# Patient Record
Sex: Female | Born: 2000
Health system: Southern US, Community
[De-identification: ages and names within clinical notes are randomized; demographics above are authoritative.]

## PROBLEM LIST (undated history)

## (undated) ENCOUNTER — Ambulatory Visit: Admission: EM | Payer: Federal, State, Local not specified - PPO

## (undated) DIAGNOSIS — F419 Anxiety disorder, unspecified: Secondary | ICD-10-CM

## (undated) DIAGNOSIS — K219 Gastro-esophageal reflux disease without esophagitis: Secondary | ICD-10-CM

## (undated) HISTORY — DX: Anxiety disorder, unspecified: F41.9

## (undated) HISTORY — DX: Gastro-esophageal reflux disease without esophagitis: K21.9

---
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
## 2019-01-09 ENCOUNTER — Other Ambulatory Visit: Payer: Self-pay

## 2019-01-09 ENCOUNTER — Telehealth: Payer: Federal, State, Local not specified - PPO | Admitting: Gastroenterology

## 2019-01-11 ENCOUNTER — Other Ambulatory Visit: Payer: Self-pay

## 2019-01-11 ENCOUNTER — Telehealth (INDEPENDENT_AMBULATORY_CARE_PROVIDER_SITE_OTHER): Payer: Federal, State, Local not specified - PPO | Admitting: Gastroenterology

## 2019-01-11 ENCOUNTER — Encounter: Payer: Self-pay | Admitting: Gastroenterology

## 2019-01-11 VITALS — Ht 68.0 in | Wt 185.0 lb

## 2019-01-11 DIAGNOSIS — K219 Gastro-esophageal reflux disease without esophagitis: Secondary | ICD-10-CM | POA: Diagnosis not present

## 2019-01-11 DIAGNOSIS — R110 Nausea: Secondary | ICD-10-CM

## 2019-01-11 NOTE — Progress Notes (Signed)
Chief Complaint: GERD, nausea  Referring Provider:    Self   HPI:    Due to current restrictions/limitations of in-office visits due to the COVID-19 pandemic, this scheduled clinical appointment was converted to a telehealth virtual consultation using Dox[employer].  -Time of medical discussion: 20 minutes -The patient did consent to this virtual visit and is aware of possible charges through their insurance for this visit.  -Names of all parties present: Kaylee Carson (patient), Gerrit Heck, DO, [facility] (physician) -Patient location: Home -Physician location: Office  Kaylee Carson is a 18 y.o. female presenting to the Gastroenterology Clinic for evaluation of reflux symptoms and nausea.  She states she has a hx of reflux diagnosed in HS, treated with omeprazole, which controlled sxs, but stopped due to side effects (decreased appetite). Has had recurrence of her index reflux symptoms, described as HB, regurgitation, increased belching, nausea w/o emesis, and indigestion.  Occasional globus sensation. No changes in weight. No dysphagia. No previous EGD. Now taking Tums and Alka Seltzer near daily. Exacerbated by acidic foods. Sxs can occur throughout the day or night along with typically 30 mins post PO.   FHx n/f heartburn (father) and IBS ([facility]).  Otherwise, no known family history of GI malignancy, IBD, liver disease.  No recent labs or imaging for review.   Past medical history, past surgical history, social history, family history, medications, and allergies reviewed in the chart and with patient.    History reviewed. No pertinent past medical history.   History reviewed. No pertinent surgical history. Family History  Problem Relation Age of Onset  . Colon cancer Neg Hx    Social History   Tobacco Use  . Smoking status: Never Smoker  . Smokeless tobacco: Never Used  Substance Use Topics  . Alcohol use: Not Currently  . Drug use: Not on file   No current  outpatient medications on file.   No current facility-administered medications for this visit.    Not on File   Review of Systems: All systems reviewed and negative except where noted in HPI.     Physical Exam:    Complete physical exam not completed due to the nature of this telehealth communication.   Gen: Awake, alert, and oriented, and well communicative. HEENT: EOMI, non-icteric sclera, NCAT, MMM Neck: Normal movement of head and neck Pulm: No labored breathing, speaking in full sentences without conversational dyspnea Derm: No apparent lesions or bruising in visible field MS: Moves all visible extremities without noticeable abnormality Psych: Pleasant, cooperative, normal speech, thought processing seemingly intact   ASSESSMENT AND PLAN;   1) GERD 2) Nausea without emesis  Discussed the pathophysiology of reflux at length today, to include diagnostic evaluation and treatment options.  Offered course of high-dose PPI therapy x4 weeks for diagnostic/therapeutic intent.  However, given her previous intolerance to omeprazole and consideration for wanting antireflux surgical options, we will instead proceed as below:  -EGD to evaluate for LES laxity, hiatal hernia, erosive esophagitis, and for preoperative assessment for a possible antireflux surgery as treatment option - Continue antireflux lifestyle measures and dietary modifications/food avoidance - Suspect nausea is component of her underlying reflux, but will certainly plan to evaluate for gastritis, PUD,  GOO, and gastric biopsies at time of EGD  The indications, risks, and benefits of EGD were explained to the patient in detail. Risks include but are not limited to bleeding, perforation, adverse reaction to medications, and  cardiopulmonary compromise. Sequelae include but are not limited to the possibility of surgery, hositalization, and mortality. The patient verbalized understanding and wished to proceed. All questions  answered, referred to scheduler. Further recommendations pending results of the exam.     Shellia CleverlyVito V Feliciana Narayan, DO, FACG  01/11/2019, 10:19 AM   Nyoka CowdenMacDonald, Laurie, MD

## 2019-01-11 NOTE — Patient Instructions (Addendum)
If you are age 19 or older, your body mass index should be between 23-30. Your Body mass index is 28.13 kg/m. If this is out of the aforementioned range listed, please consider follow up with your Primary Care Provider.  If you are age 54 or younger, your body mass index should be between 19-25. Your Body mass index is 28.13 kg/m. If this is out of the aformentioned range listed, please consider follow up with your Primary Care Provider.   To help prevent the possible spread of infection to our patients, communities, and staff; we will be implementing the following measures:  As of now we are not allowing any visitors/family members to accompany you to any upcoming appointments with [facility]. If you have any concerns about this please contact our office to discuss prior to the appointment.   You have been scheduled for an endoscopy. Please follow written instructions given to you at your visit today. If you use inhalers (even only as needed), please bring them with you on the day of your procedure. Your physician has requested that you go to www.startemmi.com and enter the access code given to you at your visit today. This web site gives a general overview about your procedure. However, you should still follow specific instructions given to you by our office regarding your preparation for the procedure.  It was a pleasure to see you today!  Vito Cirigliano, D.O.

## 2019-01-18 DIAGNOSIS — K006 Disturbances in tooth eruption: Secondary | ICD-10-CM | POA: Diagnosis not present

## 2019-01-18 DIAGNOSIS — K011 Impacted teeth: Secondary | ICD-10-CM | POA: Diagnosis not present

## 2019-01-18 HISTORY — PX: WISDOM TOOTH EXTRACTION: SHX21

## 2019-01-23 ENCOUNTER — Telehealth: Payer: Self-pay | Admitting: Gastroenterology

## 2019-01-23 NOTE — Telephone Encounter (Signed)
Spoke with patient regarding Covid-19 screening questions °Covid-19 Screening Questions: ° °Do you now or have you had a fever in the last 14 days? no ° °Do you have any respiratory symptoms of shortness of breath or  °cough now or in the last 14 days? No   ° °Do you have any family members or close contacts with diagnosed or suspected Covid-19 in the past 14 days? no ° °Have you been tested for Covid-19 and found to be positive?  no ° °Pt made aware of that care partner may wait in the car or come up to the lobby during the procedure but will need to provide their own mask. °

## 2019-01-24 ENCOUNTER — Ambulatory Visit (AMBULATORY_SURGERY_CENTER): Payer: Federal, State, Local not specified - PPO | Admitting: Gastroenterology

## 2019-01-24 ENCOUNTER — Encounter: Payer: Federal, State, Local not specified - PPO | Admitting: Gastroenterology

## 2019-01-24 ENCOUNTER — Encounter: Payer: Self-pay | Admitting: Gastroenterology

## 2019-01-24 ENCOUNTER — Other Ambulatory Visit: Payer: Self-pay

## 2019-01-24 VITALS — BP 117/72 | HR 77 | Temp 98.4°F | Resp 18 | Ht 68.0 in | Wt 185.0 lb

## 2019-01-24 DIAGNOSIS — K2970 Gastritis, unspecified, without bleeding: Secondary | ICD-10-CM | POA: Diagnosis not present

## 2019-01-24 DIAGNOSIS — K219 Gastro-esophageal reflux disease without esophagitis: Secondary | ICD-10-CM

## 2019-01-24 DIAGNOSIS — K259 Gastric ulcer, unspecified as acute or chronic, without hemorrhage or perforation: Secondary | ICD-10-CM

## 2019-01-24 DIAGNOSIS — K2950 Unspecified chronic gastritis without bleeding: Secondary | ICD-10-CM | POA: Diagnosis not present

## 2019-01-24 DIAGNOSIS — R110 Nausea: Secondary | ICD-10-CM

## 2019-01-24 MED ORDER — PANTOPRAZOLE SODIUM 40 MG PO TBEC: DELAYED_RELEASE_TABLET | ORAL | 1 refills | Status: DC

## 2019-01-24 MED ORDER — SODIUM CHLORIDE 0.9 % IV SOLN: 500.0000 mL | Freq: Once | INTRAVENOUS | Status: DC

## 2019-01-24 NOTE — Progress Notes (Signed)
Report to PACU, RN, vss, BBS= Clear.  

## 2019-01-24 NOTE — Progress Notes (Signed)
WR- Temp CW- Vitals 

## 2019-01-24 NOTE — Op Note (Signed)
[facility] Patient Name: Kaylee Carson Procedure Date: 01/24/2019 9:02 AM MRN: [id number] Endoscopist: Doristine LocksVito Augustine Brannick , MD Age: [age] Referring MD:  Date of Birth: [birth year] Gender: Female Account #: [id number] Procedure:                Upper GI endoscopy Indications:              Heartburn, Suspected esophageal reflux,                            Preoperative assessment for possible antireflux                            surgery, Nausea Medicines:                Monitored Anesthesia Care Procedure:                Pre-Anesthesia Assessment:                           - Prior to the procedure, a History and Physical                            was performed, and patient medications and                            allergies were reviewed. The patient's tolerance of                            previous anesthesia was also reviewed. The risks                            and benefits of the procedure and the sedation                            options and risks were discussed with the patient.                            All questions were answered, and informed consent                            was obtained. Prior Anticoagulants: The patient has                            taken no previous anticoagulant or antiplatelet                            agents. ASA Grade Assessment: I - A normal, healthy                            patient. After reviewing the risks and benefits,                            the patient was deemed in satisfactory condition to  undergo the procedure.                           After obtaining informed consent, the endoscope was                            passed under direct vision. Throughout the                            procedure, the patient's blood pressure, pulse, and                            oxygen saturations were monitored continuously. The                            Endoscope was introduced through the mouth, and              advanced to the second part of duodenum. The upper                            GI endoscopy was accomplished without difficulty.                            The patient tolerated the procedure well. Scope In: Scope Out: Findings:                 The examined esophagus was normal.                           The Z-line was regular and was found 41 cm from the                            incisors.                           The gastroesophageal flap valve was visualized                            endoscopically and classified as Hill Grade I                            (prominent fold, tight to endoscope).                           Multiple non-bleeding erosions were found in the                            gastric body and in the gastric antrum. There was                            mild scattered gastritis noted in the gastric                            fundus, gastric body, and antrum. Several mucosal  biopsies were taken with a cold forceps for                            Helicobacter pylori testing. Estimated blood loss                            was minimal.                           The pylorus was normal and patent and easily                            traversed.                           The duodenal bulb, first portion of the duodenum                            and second portion of the duodenum were normal. Complications:            No immediate complications. Estimated Blood Loss:     Estimated blood loss was minimal. Impression:               - Normal esophagus.                           - Z-line regular, 41 cm from the incisors.                           - Gastroesophageal flap valve classified as Hill                            Grade I (prominent fold, tight to endoscope).                           - Gastric erosions and gastritis without bleeding.                            Biopsied.                           - Normal pylorus.                            - Normal duodenal bulb, first portion of the                            duodenum and second portion of the duodenum. Recommendation:           - Patient has a contact number available for                            emergencies. The signs and symptoms of potential                            delayed complications were discussed with the  patient. Return to normal activities tomorrow.                            Written discharge instructions were provided to the                            patient.                           - Resume previous diet.                           - Await pathology results.                           - Use Protonix (pantoprazole) 40 mg PO BID for 6                            weeks to promote mucosal healing, then reduce to 40                            mg daily for continued control of reflux. Will plan                            to titrate to the lowest effective dose.                           - Return to GI clinic at appointment to be                            scheduled.                           - Pending response to therapy, if ultimately                            interested in antireflux surgical options, namely                            Transoral Incisionless Fundoplication (TIF), as a                            means to control reflux, will plan for further                            evaluation with a pH/Impedance test (off PPI                            therapy). Doristine LocksVito Moishe Schellenberg, MD 01/24/2019 9:36:27 AM

## 2019-01-24 NOTE — Patient Instructions (Signed)
Please read handouts provided. Await for pathology results. Use Protonix ( pantoprazole) 40 mg twice daily for 6 weeks to promote mucosal healing, then reduce to 40 mg daily for continued control of reflux. Return to GI clinic at appointment to be scheduled.      YOU HAD AN ENDOSCOPIC PROCEDURE TODAY AT [facility]:   Refer to the procedure report that was given to you for any specific questions about what was found during the examination.  If the procedure report does not answer your questions, please call your gastroenterologist to clarify.  If you requested that your care partner not be given the details of your procedure findings, then the procedure report has been included in a sealed envelope for you to review at your convenience later.  YOU SHOULD EXPECT: Some feelings of bloating in the abdomen. Passage of more gas than usual.  Walking can help get rid of the air that was put into your GI tract during the procedure and reduce the bloating. If you had a lower endoscopy (such as a colonoscopy or flexible sigmoidoscopy) you may notice spotting of blood in your stool or on the toilet paper. If you underwent a bowel prep for your procedure, you may not have a normal bowel movement for a few days.  Please Note:  You might notice some irritation and congestion in your nose or some drainage.  This is from the oxygen used during your procedure.  There is no need for concern and it should clear up in a day or so.  SYMPTOMS TO REPORT IMMEDIATELY:    Following upper endoscopy (EGD)  Vomiting of blood or coffee ground material  New chest pain or pain under the shoulder blades  Painful or persistently difficult swallowing  New shortness of breath  Fever of 100F or higher  Black, tarry-looking stools  For urgent or emergent issues, a gastroenterologist can be reached at any hour by calling [phone].   DIET:  We do recommend a small meal at first, but then you may  proceed to your regular diet.  Drink plenty of fluids but you should avoid alcoholic beverages for 24 hours.  ACTIVITY:  You should plan to take it easy for the rest of today and you should NOT DRIVE or use heavy machinery until tomorrow (because of the sedation medicines used during the test).    FOLLOW UP: Our staff will call the number listed on your records 48-72 hours following your procedure to check on you and address any questions or concerns that you may have regarding the information given to you following your procedure. If we do not reach you, we will leave a message.  We will attempt to reach you two times.  During this call, we will ask if you have developed any symptoms of COVID 19. If you develop any symptoms (ie: fever, flu-like symptoms, shortness of breath, cough etc.) before then, please call [phone].  If you test positive for Covid 19 in the 2 weeks post procedure, please call and report this information to [place].    If any biopsies were taken you will be contacted by phone or by letter within the next 1-3 weeks.  Please call us at [phone] if you have not heard about the biopsies in 3 weeks.    SIGNATURES/CONFIDENTIALITY: You and/or your care partner have signed paperwork which will be entered into your electronic medical record.  These signatures attest to the fact that that the information above on  your After Visit Summary has been reviewed and is understood.  Full responsibility of the confidentiality of this discharge information lies with you and/or your care-partner. 

## 2019-01-24 NOTE — Progress Notes (Signed)
Temp taken by WR VS taken by CW 

## 2019-01-26 ENCOUNTER — Telehealth: Payer: Self-pay

## 2019-01-26 NOTE — Telephone Encounter (Signed)
  Follow up Call-  Call back number 01/24/2019  Post procedure Call Back phone  # [phone]  Permission to leave phone message Yes  Some recent data might be hidden     Patient questions:  Do you have a fever, pain , or abdominal swelling? No. Pain Score  0 *  Have you tolerated food without any problems? Yes.    Have you been able to return to your normal activities? Yes.    Do you have any questions about your discharge instructions: Diet   No. Medications  No. Follow up visit  No.  Do you have questions or concerns about your Care? No.  Actions: * If pain score is 4 or above: No action needed, pain <4.  1. Have you developed a fever since your procedure? no  2.   Have you had an respiratory symptoms (SOB or cough) since your procedure? no  3.   Have you tested positive for COVID 19 since your procedure no  4.   Have you had any family members/close contacts diagnosed with the COVID 19 since your procedure?  no   If yes to any of these questions please route to Joylene John, RN and Alphonsa Gin, Therapist, sports.

## 2019-01-26 NOTE — Telephone Encounter (Signed)
Attempted to reach patient for post-procedure f/u call. No answer. Left message that we will make another attempt to reach her later today and for her to please not hesitate to call us if she has any questions/concerns regarding her care or if she develops SXS of COVID19 in the near future. 

## 2019-01-29 ENCOUNTER — Encounter: Payer: Self-pay | Admitting: Gastroenterology

## 2019-01-30 ENCOUNTER — Encounter: Payer: Self-pay | Admitting: Gastroenterology

## 2019-02-07 ENCOUNTER — Telehealth: Payer: Self-pay

## 2019-02-07 NOTE — Telephone Encounter (Signed)
Covid-19 screening questions   Do you now or have you had a fever in the last 14 days?  Do you have any respiratory symptoms of shortness of breath or cough now or in the last 14 days?  Do you have any family members or close contacts with diagnosed or suspected Covid-19 in the past 14 days?  Have you been tested for Covid-19 and found to be positive?      LVM for patient to call to be asked these questions

## 2019-02-08 ENCOUNTER — Encounter: Payer: Self-pay | Admitting: Gastroenterology

## 2019-02-08 ENCOUNTER — Other Ambulatory Visit: Payer: Self-pay

## 2019-02-08 ENCOUNTER — Ambulatory Visit: Payer: Federal, State, Local not specified - PPO | Admitting: Gastroenterology

## 2019-02-08 VITALS — BP 114/64 | HR 92 | Temp 98.5°F | Ht 68.0 in | Wt 190.1 lb

## 2019-02-08 DIAGNOSIS — K279 Peptic ulcer, site unspecified, unspecified as acute or chronic, without hemorrhage or perforation: Secondary | ICD-10-CM | POA: Diagnosis not present

## 2019-02-08 DIAGNOSIS — R110 Nausea: Secondary | ICD-10-CM | POA: Diagnosis not present

## 2019-02-08 DIAGNOSIS — K2970 Gastritis, unspecified, without bleeding: Secondary | ICD-10-CM

## 2019-02-08 DIAGNOSIS — K219 Gastro-esophageal reflux disease without esophagitis: Secondary | ICD-10-CM | POA: Diagnosis not present

## 2019-02-08 MED ORDER — PANTOPRAZOLE SODIUM 40 MG PO TBEC: 40.0000 mg | DELAYED_RELEASE_TABLET | Freq: Every day | ORAL | 3 refills | Status: DC

## 2019-02-08 NOTE — Telephone Encounter (Signed)
Covid-19 screening questions   Do you now or have you had a fever in the last 14 days? No  Do you have any respiratory symptoms of shortness of breath or cough now or in the last 14 days? No  Do you have any family members or close contacts with diagnosed or suspected Covid-19 in the past 14 days? No  Have you been tested for Covid-19 and found to be positive? No        

## 2019-02-08 NOTE — Progress Notes (Signed)
P  Chief Complaint:    Epigastric pain, GERD, nausea  GI History: 18 year old female with a history of reflux diagnosed in high school, treated with omeprazole, which had controlled symptoms, but stopped due to side effects (decreased appetite).  Recurrence of index reflux symptoms in 2020, described as heartburn, regurgitation, increased belching, nausea without emesis, and indigestion.  Occasional globus sensation.  No dysphasia.  Treated with on-demand Tums and Alka-Seltzer, taking near daily.  Worse with acidic foods.  Positive nocturnal symptoms.  Worse 30 minutes postprandial.  EGD without erosive esophagitis, but did demonstrate erosive gastritis.  Started on high-dose PPI-Protonix 40 mg p.o. twice daily x6 weeks.  Endoscopic history: -EGD (01/24/2019, Dr. Barron Alvineirigliano): Normal esophagus, Hill grade 1 valve, non-H. pylori gastritis with multiple gastric nonbleeding erosions, normal duodenum  HPI:    Patient is a 18 y.o. female presenting to the Gastroenterology Clinic for follow-up.  Initially seen last month with MEG pain and reflux symptoms and nausea.  EGD earlier this month with normal esophagus, normal valve, non-H. pylori gastritis with multiple nonbleeding gastric erosions.  Started on Protonix 40 mg p.o. twice daily.  Today states she has near resolution of GI sxs. Reflux well controlled, nausea/vomiting resolved. Still with post prandial epigastric pain, but less intense, but still occurs most days/week.  Otherwise tolerating well p.o. intake.  Weight stable.   Review of systems:     No chest pain, no SOB, no fevers, no urinary sx   History reviewed. No pertinent past medical history.  Patient's surgical history, family medical history, social history, medications and allergies were all reviewed in Epic    Current Outpatient Medications  Medication Sig Dispense Refill  . CLINPRO 5000 1.1 % PSTE Take 1 application by mouth.    . pantoprazole (PROTONIX) 40 MG tablet  Protonix 40 mg twice daily for 6 weeks, then reduce to 40 mg daily for control of reflux. 180 tablet 1   No current facility-administered medications for this visit.     Physical Exam:     BP 114/64   Pulse 92   Temp 98.5 F (36.9 C)   Ht 5\' 8"  (1.727 m)   Wt 190 lb 2 oz (86.2 kg)   LMP 01/23/2019 (Exact Date)   BMI 28.91 kg/m   GENERAL:  Pleasant female in NAD PSYCH: : Cooperative, normal affect EENT:  conjunctiva pink, mucous membranes moist, neck supple without masses CARDIAC:  RRR, no murmur heard, no peripheral edema PULM: Normal respiratory effort, lungs CTA bilaterally, no wheezing ABDOMEN:  Nondistended, soft, nontender. No obvious masses, no hepatomegaly,  normal bowel sounds SKIN:  turgor, no lesions seen Musculoskeletal:  Normal muscle tone, normal strength NEURO: Alert and oriented x 3, no focal neurologic deficits   IMPRESSION and PLAN:    1) GERD 2) Gastritis/PUD  - Resume Protonix 40 mg p.o. twice daily x6 weeks total - After 6 weeks of high-dose PPI, and provided symptoms well controlled, plan to reduce to 40 mg daily.  Given preponderance of nocturnal reflux symptoms, plan for 30 to 60 minutes before dinner -Resume antireflux lifestyle measures - Repeat EGD in 6 weeks to evaluate for mucosal healing - Did discuss long-term treatment strategies of reflux, to include ongoing medical management versus surgical intervention, namely TIF.  Given novelty of diagnosis, certainly agree with trial of PPI therapy before making surgical decision  3) Nausea: - Resolved    RTC after repeat EGD  The indications, risks, and benefits of EGD were explained to  the patient in detail. Risks include but are not limited to bleeding, perforation, adverse reaction to medications, and cardiopulmonary compromise. Sequelae include but are not limited to the possibility of surgery, hositalization, and mortality. The patient verbalized understanding and wished to proceed. All  questions answered, referred to scheduler. Further recommendations pending results of the exam.        Dominic Pea Christoper Bushey ,DO, FACG 02/08/2019, 1:53 PM

## 2019-02-08 NOTE — Patient Instructions (Signed)
If you are age 19 or older, your body mass index should be between 23-30. Your Body mass index is 28.91 kg/m. If this is out of the aforementioned range listed, please consider follow up with your Primary Care Provider.  If you are age 74 or younger, your body mass index should be between 19-25. Your Body mass index is 28.91 kg/m. If this is out of the aformentioned range listed, please consider follow up with your Primary Care Provider.   To help prevent the possible spread of infection to our patients, communities, and staff; we will be implementing the following measures:  As of now we are not allowing any visitors/family members to accompany you to any upcoming appointments with [facility]. If you have any concerns about this please contact our office to discuss prior to the appointment.   We have sent the following medications to your pharmacy for you to pick up at your convenience: Protonix 40mg  twice daily for 6 weeks then decrease to 40mg  daily before dinner.  You have been scheduled for an endoscopy. Please follow written instructions given to you at your visit today. If you use inhalers (even only as needed), please bring them with you on the day of your procedure. Your physician has requested that you go to www.startemmi.com and enter the access code given to you at your visit today. This web site gives a general overview about your procedure. However, you should still follow specific instructions given to you by our office regarding your preparation for the procedure.  It was a pleasure to see you today!  Vito Cirigliano, D.O.

## 2019-02-12 ENCOUNTER — Encounter: Payer: Federal, State, Local not specified - PPO | Admitting: Gastroenterology

## 2019-02-19 ENCOUNTER — Encounter: Payer: Self-pay | Admitting: Gastroenterology

## 2019-03-05 ENCOUNTER — Ambulatory Visit (AMBULATORY_SURGERY_CENTER): Payer: Federal, State, Local not specified - PPO | Admitting: Gastroenterology

## 2019-03-05 ENCOUNTER — Encounter: Payer: Self-pay | Admitting: Gastroenterology

## 2019-03-05 ENCOUNTER — Other Ambulatory Visit: Payer: Self-pay

## 2019-03-05 VITALS — BP 103/67 | HR 80 | Temp 97.8°F | Resp 22 | Ht 68.0 in | Wt 190.0 lb

## 2019-03-05 DIAGNOSIS — K219 Gastro-esophageal reflux disease without esophagitis: Secondary | ICD-10-CM

## 2019-03-05 HISTORY — PX: UPPER GASTROINTESTINAL ENDOSCOPY: SHX188

## 2019-03-05 MED ORDER — SODIUM CHLORIDE 0.9 % IV SOLN: 500.0000 mL | Freq: Once | INTRAVENOUS | Status: DC

## 2019-03-05 NOTE — Patient Instructions (Signed)
YOU HAD AN ENDOSCOPIC PROCEDURE TODAY AT [facility]:   Refer to the procedure report that was given to you for any specific questions about what was found during the examination.  If the procedure report does not answer your questions, please call your gastroenterologist to clarify.  If you requested that your care partner not be given the details of your procedure findings, then the procedure report has been included in a sealed envelope for you to review at your convenience later.  YOU SHOULD EXPECT: Some feelings of bloating in the abdomen. Passage of more gas than usual.  Walking can help get rid of the air that was put into your GI tract during the procedure and reduce the bloating. If you had a lower endoscopy (such as a colonoscopy or flexible sigmoidoscopy) you may notice spotting of blood in your stool or on the toilet paper. If you underwent a bowel prep for your procedure, you may not have a normal bowel movement for a few days.  Please Note:  You might notice some irritation and congestion in your nose or some drainage.  This is from the oxygen used during your procedure.  There is no need for concern and it should clear up in a day or so.  SYMPTOMS TO REPORT IMMEDIATELY:     Following upper endoscopy (EGD)  Vomiting of blood or coffee ground material  New chest pain or pain under the shoulder blades  Painful or persistently difficult swallowing  New shortness of breath  Fever of 100F or higher  Black, tarry-looking stools  For urgent or emergent issues, a gastroenterologist can be reached at any hour by calling [phone].   DIET:  We do recommend a small meal at first, but then you may proceed to your regular diet.  Drink plenty of fluids but you should avoid alcoholic beverages for 24 hours.  ACTIVITY:  You should plan to take it easy for the rest of today and you should NOT DRIVE or use heavy machinery until tomorrow (because of the sedation medicines  used during the test).    FOLLOW UP: Our staff will call the number listed on your records 48-72 hours following your procedure to check on you and address any questions or concerns that you may have regarding the information given to you following your procedure. If we do not reach you, we will leave a message.  We will attempt to reach you two times.  During this call, we will ask if you have developed any symptoms of COVID 19. If you develop any symptoms (ie: fever, flu-like symptoms, shortness of breath, cough etc.) before then, please call [phone].  If you test positive for Covid 19 in the 2 weeks post procedure, please call and report this information to [place].    If any biopsies were taken you will be contacted by phone or by letter within the next 1-3 weeks.  Please call us at [phone] if you have not heard about the biopsies in 3 weeks.    SIGNATURES/CONFIDENTIALITY: You and/or your care partner have signed paperwork which will be entered into your electronic medical record.  These signatures attest to the fact that that the information above on your After Visit Summary has been reviewed and is understood.  Full responsibility of the confidentiality of this discharge information lies with you and/or your care-partner.      You may resume your current medications today. Please call if any questions or concerns.

## 2019-03-05 NOTE — Progress Notes (Signed)
Report to PACU, RN, vss, BBS= Clear.  

## 2019-03-05 NOTE — Op Note (Signed)
[facility] Patient Name: Kaylee Carson Procedure Date: 03/05/2019 1:30 PM MRN: [id number] Endoscopist: Doristine Locks , MD Age: 17 Referring MD:  Date of Birth: 26-Jun-2000 Gender: Female Account #: [id number] Procedure:                Upper GI endoscopy Indications:              Esophageal reflux, Follow-up of gastritis and                            gastric erosions Medicines:                Monitored Anesthesia Care Procedure:                Pre-Anesthesia Assessment:                           - Prior to the procedure, a History and Physical                            was performed, and patient medications and                            allergies were reviewed. The patient's tolerance of                            previous anesthesia was also reviewed. The risks                            and benefits of the procedure and the sedation                            options and risks were discussed with the patient.                            All questions were answered, and informed consent                            was obtained. Prior Anticoagulants: The patient has                            taken no previous anticoagulant or antiplatelet                            agents. ASA Grade Assessment: II - A patient with                            mild systemic disease. After reviewing the risks                            and benefits, the patient was deemed in                            satisfactory condition to undergo the procedure.  After obtaining informed consent, the endoscope was                            passed under direct vision. Throughout the                            procedure, the patient's blood pressure, pulse, and                            oxygen saturations were monitored continuously. The                            Endoscope was introduced through the mouth, and                            advanced to the second part of duodenum. The  upper                            GI endoscopy was accomplished without difficulty.                            The patient tolerated the procedure well. Scope In: Scope Out: Findings:                 The examined esophagus was normal.                           The entire examined stomach was normal.                           The duodenal bulb, first portion of the duodenum                            and second portion of the duodenum were normal. Complications:            No immediate complications. Estimated Blood Loss:     Estimated blood loss: none. Impression:               - Normal esophagus.                           - Normal stomach. The previously visualized                            gastritis and gastric erosions/ulcers have since                            healing.                           - Normal duodenal bulb, first portion of the                            duodenum and second portion of the duodenum.                           -  No specimens collected. Recommendation:           - Patient has a contact number available for                            emergencies. The signs and symptoms of potential                            delayed complications were discussed with the                            patient. Return to normal activities tomorrow.                            Written discharge instructions were provided to the                            patient.                           - Resume previous diet.                           - Continue present medications.                           - Return to GI clinic PRN. Doristine LocksVito Zera Markwardt, MD 03/05/2019 1:41:21 PM

## 2019-03-05 NOTE — Progress Notes (Signed)
Pt's states no medical or surgical changes since previsit or office visit.  June temp Courtney Vitals

## 2019-03-05 NOTE — Progress Notes (Signed)
No problems noted in the recovery room. maw 

## 2019-03-07 ENCOUNTER — Telehealth: Payer: Self-pay | Admitting: *Deleted

## 2019-03-07 ENCOUNTER — Telehealth: Payer: Self-pay

## 2019-03-07 NOTE — Telephone Encounter (Signed)
Second follow up call attempt.  Left message on voicemail to call if any questions or concerns regarding procedure or covid19.

## 2019-03-07 NOTE — Telephone Encounter (Signed)
No answer, left message to call back later today, B.Guhan Bruington RN. 

## 2019-03-08 NOTE — Telephone Encounter (Signed)
Left message

## 2019-03-14 DIAGNOSIS — F4323 Adjustment disorder with mixed anxiety and depressed mood: Secondary | ICD-10-CM | POA: Diagnosis not present

## 2019-03-22 DIAGNOSIS — F4323 Adjustment disorder with mixed anxiety and depressed mood: Secondary | ICD-10-CM | POA: Diagnosis not present

## 2019-03-27 DIAGNOSIS — F4323 Adjustment disorder with mixed anxiety and depressed mood: Secondary | ICD-10-CM | POA: Diagnosis not present

## 2019-04-06 DIAGNOSIS — F4323 Adjustment disorder with mixed anxiety and depressed mood: Secondary | ICD-10-CM | POA: Diagnosis not present

## 2019-04-16 DIAGNOSIS — F4323 Adjustment disorder with mixed anxiety and depressed mood: Secondary | ICD-10-CM | POA: Diagnosis not present

## 2019-04-26 DIAGNOSIS — F4323 Adjustment disorder with mixed anxiety and depressed mood: Secondary | ICD-10-CM | POA: Diagnosis not present

## 2019-05-07 DIAGNOSIS — F4323 Adjustment disorder with mixed anxiety and depressed mood: Secondary | ICD-10-CM | POA: Diagnosis not present

## 2019-05-15 DIAGNOSIS — F4323 Adjustment disorder with mixed anxiety and depressed mood: Secondary | ICD-10-CM | POA: Diagnosis not present

## 2019-05-29 DIAGNOSIS — F4323 Adjustment disorder with mixed anxiety and depressed mood: Secondary | ICD-10-CM | POA: Diagnosis not present

## 2019-06-07 DIAGNOSIS — F4323 Adjustment disorder with mixed anxiety and depressed mood: Secondary | ICD-10-CM | POA: Diagnosis not present

## 2019-06-11 DIAGNOSIS — J3489 Other specified disorders of nose and nasal sinuses: Secondary | ICD-10-CM | POA: Diagnosis not present

## 2019-06-11 DIAGNOSIS — Z20828 Contact with and (suspected) exposure to other viral communicable diseases: Secondary | ICD-10-CM | POA: Diagnosis not present

## 2019-07-05 DIAGNOSIS — Z20828 Contact with and (suspected) exposure to other viral communicable diseases: Secondary | ICD-10-CM | POA: Diagnosis not present

## 2019-07-05 DIAGNOSIS — Z20822 Contact with and (suspected) exposure to covid-19: Secondary | ICD-10-CM | POA: Diagnosis not present

## 2019-12-25 DIAGNOSIS — F4323 Adjustment disorder with mixed anxiety and depressed mood: Secondary | ICD-10-CM | POA: Diagnosis not present

## 2019-12-31 DIAGNOSIS — F4323 Adjustment disorder with mixed anxiety and depressed mood: Secondary | ICD-10-CM | POA: Diagnosis not present

## 2020-01-08 DIAGNOSIS — F4323 Adjustment disorder with mixed anxiety and depressed mood: Secondary | ICD-10-CM | POA: Diagnosis not present

## 2020-01-22 DIAGNOSIS — F4323 Adjustment disorder with mixed anxiety and depressed mood: Secondary | ICD-10-CM | POA: Diagnosis not present

## 2020-01-31 DIAGNOSIS — F4323 Adjustment disorder with mixed anxiety and depressed mood: Secondary | ICD-10-CM | POA: Diagnosis not present

## 2020-02-05 DIAGNOSIS — F4323 Adjustment disorder with mixed anxiety and depressed mood: Secondary | ICD-10-CM | POA: Diagnosis not present

## 2020-02-19 DIAGNOSIS — F4323 Adjustment disorder with mixed anxiety and depressed mood: Secondary | ICD-10-CM | POA: Diagnosis not present

## 2020-02-21 ENCOUNTER — Emergency Department
Admission: EM | Admit: 2020-02-21 | Discharge: 2020-02-21 | Disposition: A | Discharge status: Home / Self Care | Payer: Federal, State, Local not specified - PPO | Source: Home / Self Care

## 2020-02-21 ENCOUNTER — Emergency Department: Admit: 2020-02-21 | Discharge status: Absent without leave | Payer: Self-pay

## 2020-02-21 ENCOUNTER — Other Ambulatory Visit: Payer: Self-pay

## 2020-02-21 DIAGNOSIS — R059 Cough, unspecified: Secondary | ICD-10-CM

## 2020-02-21 DIAGNOSIS — R05 Cough: Secondary | ICD-10-CM

## 2020-02-21 DIAGNOSIS — J069 Acute upper respiratory infection, unspecified: Secondary | ICD-10-CM

## 2020-02-21 DIAGNOSIS — H6691 Otitis media, unspecified, right ear: Secondary | ICD-10-CM

## 2020-02-21 MED ORDER — FLUTICASONE PROPIONATE 50 MCG/ACT NA SUSP: 2.0000 | Freq: Every day | NASAL | 2 refills | Status: DC

## 2020-02-21 MED ORDER — AMOXICILLIN-POT CLAVULANATE 875-125 MG PO TABS: 1.0000 | ORAL_TABLET | Freq: Two times a day (BID) | ORAL | 0 refills | Status: DC

## 2020-02-21 NOTE — ED Provider Notes (Signed)
[facility]    CSN: [id number] Arrival date & time: 02/21/20  0920      History   Chief Complaint Chief Complaint  Patient presents with  . Nasal Congestion  . Cough    HPI MAANYA HIPPERT is a 19 y.o. female.   HPI NEOSHA SWITALSKI is a 19 y.o. female presenting to UC with c/o 2 days of worsening productive cough, sinus pressure and Right ear pain.  She has taken Dayquil and Nyquil with minimal relief. She has her second Pfizer vaccine scheduled for next week. Denies fever, chills, n/v/d. No known sick contacts or recent travel.    Past Medical History:  Diagnosis Date  . GERD (gastroesophageal reflux disease)     There are no problems to display for this patient.   Past Surgical History:  Procedure Laterality Date  . UPPER GASTROINTESTINAL ENDOSCOPY  03/05/2019  . WISDOM TOOTH EXTRACTION  01/18/2019    OB History   No obstetric history on file.      Home Medications    Prior to Admission medications   Medication Sig Start Date End Date Taking? Authorizing Provider  amoxicillin-clavulanate (AUGMENTIN) 875-125 MG tablet Take 1 tablet by mouth 2 (two) times daily. One po bid x 7 days 02/21/20   Lurene Shadow, PA-C  fluticasone [facility]) 50 MCG/ACT nasal spray Place 2 sprays into both nostrils daily. 02/21/20   Lurene Shadow, PA-C  pantoprazole (PROTONIX) 40 MG tablet Protonix 40 mg twice daily for 6 weeks, then reduce to 40 mg daily for control of reflux. 01/24/19   Cirigliano, Verlin Dike, DO    Family History Family History  Problem Relation Age of Onset  . Healthy Mother   . Hypertension Father   . Colon cancer Neg Hx   . Stomach cancer Neg Hx   . Rectal cancer Neg Hx   . Esophageal cancer Neg Hx     Social History Social History   Tobacco Use  . Smoking status: Never Smoker  . Smokeless tobacco: Never Used  Vaping Use  . Vaping Use: Former  Substance Use Topics  . Alcohol use: Not Currently  . Drug use: Never     Allergies   Patient has  no known allergies.   Review of Systems Review of Systems  Constitutional: Negative for chills and fever.  HENT: Positive for congestion and ear pain. Negative for sore throat, trouble swallowing and voice change.   Respiratory: Positive for cough. Negative for shortness of breath.   Cardiovascular: Negative for chest pain and palpitations.  Gastrointestinal: Negative for abdominal pain, diarrhea, nausea and vomiting.  Musculoskeletal: Negative for arthralgias, back pain and myalgias.  Skin: Negative for rash.  Neurological: Negative for dizziness, light-headedness and headaches.  All other systems reviewed and are negative.    Physical Exam Triage Vital Signs ED Triage Vitals  Enc Vitals Group     BP 02/21/20 1009 117/74     Pulse Rate 02/21/20 1009 93     Resp 02/21/20 1009 16     Temp 02/21/20 1009 98.3 F (36.8 C)     Temp Source 02/21/20 1009 Oral     SpO2 02/21/20 1009 98 %     Weight --      Height --      Head Circumference --      Peak Flow --      Pain Score 02/21/20 1007 0     Pain Loc --      Pain  Edu? --      Excl. in GC? --    No data found.  Updated Vital Signs BP 117/74 (BP Location: Left Arm)   Pulse 93   Temp 98.3 F (36.8 C) (Oral)   Resp 16   SpO2 98%   Visual Acuity Right Eye Distance:   Left Eye Distance:   Bilateral Distance:    Right Eye Near:   Left Eye Near:    Bilateral Near:     Physical Exam Vitals and nursing note reviewed.  Constitutional:      General: She is not in acute distress.    Appearance: Normal appearance. She is well-developed. She is not ill-appearing, toxic-appearing or diaphoretic.  HENT:     Head: Normocephalic and atraumatic.     Right Ear: Ear canal normal. Tympanic membrane is erythematous and bulging.     Left Ear: Tympanic membrane and ear canal normal.     Nose: Nose normal.     Right Sinus: No maxillary sinus tenderness or frontal sinus tenderness.     Left Sinus: No maxillary sinus tenderness or  frontal sinus tenderness.     Mouth/Throat:     Lips: Pink.     Pharynx: Oropharynx is clear. Uvula midline.  Cardiovascular:     Rate and Rhythm: Normal rate and regular rhythm.  Pulmonary:     Effort: Pulmonary effort is normal. No respiratory distress.     Breath sounds: Normal breath sounds. No stridor. No wheezing, rhonchi or rales.  Musculoskeletal:        General: Normal range of motion.     Cervical back: Normal range of motion and neck supple. No tenderness.  Lymphadenopathy:     Cervical: No cervical adenopathy.  Skin:    General: Skin is warm and dry.  Neurological:     Mental Status: She is alert and oriented to person, place, and time.  Psychiatric:        Behavior: Behavior normal.      UC Treatments / Results  Labs (all labs ordered are listed, but only abnormal results are displayed) Labs Reviewed  SARS-COV-2 RNA,(COVID-19) QUALITATIVE NAAT    EKG   Radiology No results found.  Procedures Procedures (including critical care time)  Medications Ordered in UC Medications - No data to display  Initial Impression / Assessment and Plan / UC Course  I have reviewed the triage vital signs and the nursing notes.  Pertinent labs & imaging results that were available during my care of the patient were reviewed by me and considered in my medical decision making (see chart for details).     Hx and exam c/w Right AOM secondary to URI Will tx with Augmentin COVID test pending F/u with PCP as needed AVS given  Final Clinical Impressions(s) / UC Diagnoses   Final diagnoses:  Cough  Upper respiratory tract infection, unspecified type  Right acute otitis media     Discharge Instructions      Please take antibiotics as prescribed and be sure to complete entire course even if you start to feel better to ensure infection does not come back.  You may take 500mg  acetaminophen every 4-6 hours or in combination with ibuprofen 400-600mg  every 6-8 hours as  needed for pain, inflammation, and fever.  Be sure to well hydrated with clear liquids and get at least 8 hours of sleep at night, preferably more while sick.   Please follow up with family medicine in 1 week if needed.  ED Prescriptions    Medication Sig Dispense Auth. Provider   amoxicillin-clavulanate (AUGMENTIN) 875-125 MG tablet Take 1 tablet by mouth 2 (two) times daily. One po bid x 7 days 14 tablet Phelps, Erin O, PA-C   fluticasone (FLONASE) 50 MCG/ACT nasal spray Place 2 sprays into both nostrils daily. 16 g Lurene Shadow, [place]     PDMP not reviewed this encounter.   Lurene Shadow, PA-C 02/21/20 1020

## 2020-02-21 NOTE — ED Triage Notes (Signed)
Patient presents to Urgent Care with complaints of productive cough and sinus pressure (including her left ear) since two days ago. Patient reports she has been taking nyquil for her sx.  Pt has had her first covid vaccine, scheduled to get the second next week.

## 2020-02-21 NOTE — Discharge Instructions (Signed)

## 2020-02-22 DIAGNOSIS — Z20822 Contact with and (suspected) exposure to covid-19: Secondary | ICD-10-CM | POA: Diagnosis not present

## 2020-02-22 DIAGNOSIS — Z03818 Encounter for observation for suspected exposure to other biological agents ruled out: Secondary | ICD-10-CM | POA: Diagnosis not present

## 2020-02-22 LAB — SARS-COV-2 RNA,(COVID-19) QUALITATIVE NAAT: SARS CoV2 RNA: DETECTED — CR

## 2020-02-24 ENCOUNTER — Telehealth: Payer: Self-pay

## 2020-02-24 NOTE — Telephone Encounter (Signed)
Left VM for pt regarding POS covid results. Advised to continue tylenol or motrin for fever, lots of fluids. Isolate for 10 days since symptom onset unless still having fever, then extend isolation until fever free without tylenol for >24 hrs. Call if any questions.

## 2020-02-27 DIAGNOSIS — Z20822 Contact with and (suspected) exposure to covid-19: Secondary | ICD-10-CM | POA: Diagnosis not present

## 2020-02-27 DIAGNOSIS — Z03818 Encounter for observation for suspected exposure to other biological agents ruled out: Secondary | ICD-10-CM | POA: Diagnosis not present

## 2020-02-28 ENCOUNTER — Emergency Department
Admission: EM | Admit: 2020-02-28 | Discharge: 2020-02-28 | Disposition: A | Discharge status: Home / Self Care | Payer: Federal, State, Local not specified - PPO | Source: Home / Self Care

## 2020-02-28 ENCOUNTER — Other Ambulatory Visit: Payer: Self-pay

## 2020-02-28 ENCOUNTER — Telehealth: Payer: Federal, State, Local not specified - PPO | Admitting: Emergency Medicine

## 2020-02-28 DIAGNOSIS — H938X1 Other specified disorders of right ear: Secondary | ICD-10-CM

## 2020-02-28 DIAGNOSIS — Z20822 Contact with and (suspected) exposure to covid-19: Secondary | ICD-10-CM | POA: Diagnosis not present

## 2020-02-28 DIAGNOSIS — Z03818 Encounter for observation for suspected exposure to other biological agents ruled out: Secondary | ICD-10-CM | POA: Diagnosis not present

## 2020-02-28 DIAGNOSIS — H9201 Otalgia, right ear: Secondary | ICD-10-CM

## 2020-02-28 MED ORDER — CIPROFLOXACIN HCL 0.3 % OP SOLN: OPHTHALMIC | 0 refills | Status: DC

## 2020-02-28 MED ORDER — CIPROFLOXACIN-DEXAMETHASONE 0.3-0.1 % OT SUSP: 4.0000 [drp] | Freq: Two times a day (BID) | OTIC | 0 refills | Status: DC

## 2020-02-28 NOTE — Addendum Note (Signed)
Addended by: Roxy Horseman B on: 02/28/2020 08:40 AM   Modules accepted: Orders

## 2020-02-28 NOTE — ED Provider Notes (Signed)
[facility]    CSN: [id number] Arrival date & time: 02/28/20  0915      History   Chief Complaint Chief Complaint  Patient presents with  . Otalgia    HPI Kaylee Carson is a 19 y.o. female.   HPI Kaylee Carson is a 19 y.o. female presenting to UC with c/o continued Right ear pressure after completing a course of Augmentin yesterday, which was prescribed on 02/21/20 for a Right AOM secondary to URI and COVID.  Pt denies pain but states her ear feels "full."  Denies HA, dizziness, congestion, sore throat, fever, chills, n/v/d. No cough. She completed an Evisit this morning and states the only "complaint" she could click onto was "swimmer's ear" so she was prescribed Ciprodex but states it would be $200. She has not been swimming recently and is not sure that would help. Pt requesting her ear be examined in person.    Past Medical History:  Diagnosis Date  . GERD (gastroesophageal reflux disease)     There are no problems to display for this patient.   Past Surgical History:  Procedure Laterality Date  . UPPER GASTROINTESTINAL ENDOSCOPY  03/05/2019  . WISDOM TOOTH EXTRACTION  01/18/2019    OB History   No obstetric history on file.      Home Medications    Prior to Admission medications   Medication Sig Start Date End Date Taking? Authorizing Provider  amoxicillin-clavulanate (AUGMENTIN) 875-125 MG tablet Take 1 tablet by mouth 2 (two) times daily. One po bid x 7 days 02/21/20   Lurene Shadow, PA-C  ciprofloxacin (CILOXAN) 0.3 % ophthalmic solution Use 4 drops, 2 times daily. 02/28/20   Roxy Horseman, PA-C  ciprofloxacin-dexamethasone (CIPRODEX) OTIC suspension Place 4 drops into the right ear 2 (two) times daily. 02/28/20   Roxy Horseman, PA-C  fluticasone (FLONASE) 50 MCG/ACT nasal spray Place 2 sprays into both nostrils daily. 02/21/20   Lurene Shadow, PA-C  pantoprazole (PROTONIX) 40 MG tablet Protonix 40 mg twice daily for 6 weeks, then reduce to 40 mg  daily for control of reflux. 01/24/19   Cirigliano, Verlin Dike, DO    Family History Family History  Problem Relation Age of Onset  . Healthy Mother   . Hypertension Father   . Colon cancer Neg Hx   . Stomach cancer Neg Hx   . Rectal cancer Neg Hx   . Esophageal cancer Neg Hx     Social History Social History   Tobacco Use  . Smoking status: Never Smoker  . Smokeless tobacco: Never Used  Vaping Use  . Vaping Use: Former  Substance Use Topics  . Alcohol use: Not Currently  . Drug use: Never     Allergies   Patient has no known allergies.   Review of Systems Review of Systems  Constitutional: Negative for chills and fever.  HENT: Positive for ear pain (right fullness). Negative for congestion, postnasal drip, rhinorrhea and sore throat.   Neurological: Negative for dizziness, light-headedness and headaches.     Physical Exam Triage Vital Signs ED Triage Vitals  Enc Vitals Group     BP 02/28/20 0927 123/79     Pulse Rate 02/28/20 0927 76     Resp 02/28/20 0927 16     Temp 02/28/20 0927 98.3 F (36.8 C)     Temp src --      SpO2 02/28/20 0927 98 %     Weight --      Height --  Head Circumference --      Peak Flow --      Pain Score 02/28/20 0926 2     Pain Loc --      Pain Edu? --      Excl. in GC? --    No data found.  Updated Vital Signs BP 123/79 (BP Location: Right Arm)   Pulse 76   Temp 98.3 F (36.8 C)   Resp 16   SpO2 98%   Visual Acuity Right Eye Distance:   Left Eye Distance:   Bilateral Distance:    Right Eye Near:   Left Eye Near:    Bilateral Near:     Physical Exam Vitals and nursing note reviewed.  Constitutional:      General: She is not in acute distress.    Appearance: Normal appearance. She is well-developed. She is not ill-appearing, toxic-appearing or diaphoretic.  HENT:     Head: Normocephalic and atraumatic.     Right Ear: Tympanic membrane and ear canal normal.     Left Ear: Tympanic membrane and ear canal normal.      Nose: Nose normal.     Right Sinus: No maxillary sinus tenderness or frontal sinus tenderness.     Left Sinus: No maxillary sinus tenderness or frontal sinus tenderness.     Mouth/Throat:     Lips: Pink.     Mouth: Mucous membranes are moist.     Pharynx: Oropharynx is clear. Uvula midline.  Cardiovascular:     Rate and Rhythm: Normal rate and regular rhythm.  Pulmonary:     Effort: Pulmonary effort is normal. No respiratory distress.     Breath sounds: Normal breath sounds.  Musculoskeletal:        General: Normal range of motion.     Cervical back: Normal range of motion and neck supple. No tenderness.  Lymphadenopathy:     Cervical: No cervical adenopathy.  Skin:    General: Skin is warm and dry.  Neurological:     Mental Status: She is alert and oriented to person, place, and time.  Psychiatric:        Behavior: Behavior normal.      UC Treatments / Results  Labs (all labs ordered are listed, but only abnormal results are displayed) Labs Reviewed - No data to display  EKG   Radiology No results found.  Procedures Procedures (including critical care time)  Medications Ordered in UC Medications - No data to display  Initial Impression / Assessment and Plan / UC Course  I have reviewed the triage vital signs and the nursing notes.  Pertinent labs & imaging results that were available during my care of the patient were reviewed by me and considered in my medical decision making (see chart for details).    Reassured pt normal ear exam Encouraged symptomatic tx with sinus rinses F/u with PCP in 1 week if not improving, sooner if new symptoms develop AVS given  Final Clinical Impressions(s) / UC Diagnoses   Final diagnoses:  Ear fullness, right     Discharge Instructions      There is no evidence of remaining or new infection in the ear at this time. You may have success trying over the counter sinus rinses 1-2 times daily, taking ibuprofen for pain  or inflammation, and blowing your nose when needed to help keep your sinuses clear.   Follow up with family medicine in 1 week if not improving, sooner if worsening- develop pain, headache, dizziness,  or other new symptoms develop.     ED Prescriptions    None     PDMP not reviewed this encounter.   Lurene Shadow, [place] 02/28/20 1039

## 2020-02-28 NOTE — Progress Notes (Signed)
E Visit for Swimmer's Ear  We are sorry that you are not feeling well. Here is how we plan to help!  I have prescribed: Ciprodex otic suspension 3 drops in affected ears twice daily for 7 days    In certain cases swimmer's ear may progress to a more serious bacterial infection of the middle or inner ear.  If you have a fever 102 and up and significantly worsening symptoms, this could indicate a more serious infection moving to the middle/inner and needs face to face evaluation in an office by a provider.  Your symptoms should improve over the next 3 days and should resolve in about 7 days.  HOME CARE:   Wash your hands frequently.  Do not place the tip of the bottle on your ear or touch it with your fingers.  You can take Acetominophen 650 mg every 4-6 hours as needed for pain.  If pain is severe or moderate, you can apply a heating pad (set on low) or hot water bottle (wrapped in a towel) to outer ear for 20 minutes.  This will also increase drainage.  Avoid ear plugs  Do not use Q-tips  After showers, help the water run out by tilting your head to one side.  GET HELP RIGHT AWAY IF:   Fever is over 102.2 degrees.  You develop progressive ear pain or hearing loss.  Ear symptoms persist longer than 3 days after treatment.  MAKE SURE YOU:   Understand these instructions.  Will watch your condition.  Will get help right away if you are not doing well or get worse.  TO PREVENT SWIMMER'S EAR:  Use a bathing cap or custom fitted swim molds to keep your ears dry.  Towel off after swimming to dry your ears.  Tilt your head or pull your earlobes to allow the water to escape your ear canal.  If there is still water in your ears, consider using a hairdryer on the lowest setting.  Thank you for choosing an e-visit. Your e-visit answers were reviewed by a board certified advanced clinical practitioner to complete your personal care plan. Depending upon the condition, your  plan could have included both over the counter or prescription medications. Please review your pharmacy choice. Be sure that the pharmacy you have chosen is open so that you can pick up your prescription now.  If there is a problem you may message your provider in MyChart to have the prescription routed to another pharmacy. Your safety is important to [place]. If you have drug allergies check your prescription carefully.  For the next 24 hours, you can use MyChart to ask questions about today's visit, request a non-urgent call back, or ask for a work or school excuse from your e-visit provider. You will get an email in the next two days asking about your experience. I hope that your e-visit has been valuable and will speed your recovery.      Approximately 5 minutes was used in reviewing the patient's chart, questionnaire, prescribing medications, and documentation.

## 2020-02-28 NOTE — ED Triage Notes (Signed)
Patient presents to Urgent Care with complaints of continued right ear pain since last week. Patient reports she did a virtual visit earlier and they prescribed $200 ear drops that would fix swimmers ear but she doesn't think that's what she has so would like it looked at.

## 2020-02-28 NOTE — Discharge Instructions (Signed)
°  There is no evidence of remaining or new infection in the ear at this time. You may have success trying over the counter sinus rinses 1-2 times daily, taking ibuprofen for pain or inflammation, and blowing your nose when needed to help keep your sinuses clear.   Follow up with family medicine in 1 week if not improving, sooner if worsening- develop pain, headache, dizziness, or other new symptoms develop.

## 2020-02-29 DIAGNOSIS — F4323 Adjustment disorder with mixed anxiety and depressed mood: Secondary | ICD-10-CM | POA: Diagnosis not present

## 2020-02-29 DIAGNOSIS — Z03818 Encounter for observation for suspected exposure to other biological agents ruled out: Secondary | ICD-10-CM | POA: Diagnosis not present

## 2020-02-29 DIAGNOSIS — Z20822 Contact with and (suspected) exposure to covid-19: Secondary | ICD-10-CM | POA: Diagnosis not present

## 2020-03-13 DIAGNOSIS — F4323 Adjustment disorder with mixed anxiety and depressed mood: Secondary | ICD-10-CM | POA: Diagnosis not present

## 2020-03-21 DIAGNOSIS — F4323 Adjustment disorder with mixed anxiety and depressed mood: Secondary | ICD-10-CM | POA: Diagnosis not present

## 2020-03-28 DIAGNOSIS — F4323 Adjustment disorder with mixed anxiety and depressed mood: Secondary | ICD-10-CM | POA: Diagnosis not present

## 2020-04-04 DIAGNOSIS — F4323 Adjustment disorder with mixed anxiety and depressed mood: Secondary | ICD-10-CM | POA: Diagnosis not present

## 2020-04-11 DIAGNOSIS — F4323 Adjustment disorder with mixed anxiety and depressed mood: Secondary | ICD-10-CM | POA: Diagnosis not present

## 2020-04-18 DIAGNOSIS — F4323 Adjustment disorder with mixed anxiety and depressed mood: Secondary | ICD-10-CM | POA: Diagnosis not present

## 2020-04-24 DIAGNOSIS — F4323 Adjustment disorder with mixed anxiety and depressed mood: Secondary | ICD-10-CM | POA: Diagnosis not present

## 2020-05-21 DIAGNOSIS — F4323 Adjustment disorder with mixed anxiety and depressed mood: Secondary | ICD-10-CM | POA: Diagnosis not present

## 2020-05-29 DIAGNOSIS — F4323 Adjustment disorder with mixed anxiety and depressed mood: Secondary | ICD-10-CM | POA: Diagnosis not present

## 2020-06-12 DIAGNOSIS — F4323 Adjustment disorder with mixed anxiety and depressed mood: Secondary | ICD-10-CM | POA: Diagnosis not present

## 2020-06-19 DIAGNOSIS — F4323 Adjustment disorder with mixed anxiety and depressed mood: Secondary | ICD-10-CM | POA: Diagnosis not present

## 2020-06-26 DIAGNOSIS — F4323 Adjustment disorder with mixed anxiety and depressed mood: Secondary | ICD-10-CM | POA: Diagnosis not present

## 2020-07-03 DIAGNOSIS — F4323 Adjustment disorder with mixed anxiety and depressed mood: Secondary | ICD-10-CM | POA: Diagnosis not present

## 2020-07-11 DIAGNOSIS — F4323 Adjustment disorder with mixed anxiety and depressed mood: Secondary | ICD-10-CM | POA: Diagnosis not present

## 2020-07-18 DIAGNOSIS — F4323 Adjustment disorder with mixed anxiety and depressed mood: Secondary | ICD-10-CM | POA: Diagnosis not present

## 2020-08-01 DIAGNOSIS — F4323 Adjustment disorder with mixed anxiety and depressed mood: Secondary | ICD-10-CM | POA: Diagnosis not present

## 2020-08-08 DIAGNOSIS — F4323 Adjustment disorder with mixed anxiety and depressed mood: Secondary | ICD-10-CM | POA: Diagnosis not present

## 2020-08-22 DIAGNOSIS — F4323 Adjustment disorder with mixed anxiety and depressed mood: Secondary | ICD-10-CM | POA: Diagnosis not present

## 2020-09-05 DIAGNOSIS — F4323 Adjustment disorder with mixed anxiety and depressed mood: Secondary | ICD-10-CM | POA: Diagnosis not present

## 2020-09-19 DIAGNOSIS — F4323 Adjustment disorder with mixed anxiety and depressed mood: Secondary | ICD-10-CM | POA: Diagnosis not present

## 2020-10-06 ENCOUNTER — Other Ambulatory Visit: Payer: Self-pay

## 2020-10-06 ENCOUNTER — Emergency Department (INDEPENDENT_AMBULATORY_CARE_PROVIDER_SITE_OTHER): Payer: Federal, State, Local not specified - PPO

## 2020-10-06 ENCOUNTER — Emergency Department
Admission: EM | Admit: 2020-10-06 | Discharge: 2020-10-06 | Disposition: A | Discharge status: Home / Self Care | Payer: Federal, State, Local not specified - PPO | Source: Home / Self Care | Attending: Family Medicine | Admitting: Family Medicine

## 2020-10-06 DIAGNOSIS — M7989 Other specified soft tissue disorders: Secondary | ICD-10-CM | POA: Diagnosis not present

## 2020-10-06 DIAGNOSIS — M25471 Effusion, right ankle: Secondary | ICD-10-CM

## 2020-10-06 DIAGNOSIS — M79671 Pain in right foot: Secondary | ICD-10-CM | POA: Diagnosis not present

## 2020-10-06 DIAGNOSIS — M25571 Pain in right ankle and joints of right foot: Secondary | ICD-10-CM

## 2020-10-06 DIAGNOSIS — S93491A Sprain of other ligament of right ankle, initial encounter: Secondary | ICD-10-CM | POA: Diagnosis not present

## 2020-10-06 MED ORDER — ACETAMINOPHEN 325 MG PO TABS
650.0000 mg | ORAL_TABLET | Freq: Four times a day (QID) | ORAL | Status: DC | PRN
  Administered 2020-10-06: 650 mg via ORAL

## 2020-10-06 MED ORDER — IBUPROFEN 800 MG PO TABS: 800.0000 mg | ORAL_TABLET | Freq: Three times a day (TID) | ORAL | 0 refills | Status: DC

## 2020-10-06 NOTE — ED Triage Notes (Signed)
Patient presents to Urgent Care with complaints of right ankle pain since she rolled it while running in a parking lot last night. Patient reports she is able to ambulate on it, reports some tingling, is sensitive to the touch.

## 2020-10-06 NOTE — ED Provider Notes (Signed)
[facility]    CSN: [id number] Arrival date & time: 10/06/20  7106      History   Chief Complaint Chief Complaint  Patient presents with  . Ankle Pain    Right    HPI Kaylee Carson is a 20 y.o. female.   HPI   Triage note reviewed.  Patient was running in a parking lot and twisted her ankle and fell.  Ankle rolled inward.  Now painful with weight bearing.  Did use ice.  No previous ankle problems  Past Medical History:  Diagnosis Date  . GERD (gastroesophageal reflux disease)     There are no problems to display for this patient.   Past Surgical History:  Procedure Laterality Date  . UPPER GASTROINTESTINAL ENDOSCOPY  03/05/2019  . WISDOM TOOTH EXTRACTION  01/18/2019    OB History   No obstetric history on file.      Home Medications    Prior to Admission medications   Medication Sig Start Date End Date Taking? Authorizing Provider  ibuprofen (ADVIL) 800 MG tablet Take 1 tablet (800 mg total) by mouth 3 (three) times daily. 10/06/20  Yes Eustace Moore, MD  fluticasone [facility]) 50 MCG/ACT nasal spray Place 2 sprays into both nostrils daily. 02/21/20 10/06/20  Lurene Shadow, PA-C  pantoprazole (PROTONIX) 40 MG tablet Protonix 40 mg twice daily for 6 weeks, then reduce to 40 mg daily for control of reflux. 01/24/19 10/06/20  Cirigliano, Verlin Dike, DO    Family History Family History  Problem Relation Age of Onset  . Healthy Mother   . Hypertension Father   . Colon cancer Neg Hx   . Stomach cancer Neg Hx   . Rectal cancer Neg Hx   . Esophageal cancer Neg Hx     Social History Social History   Tobacco Use  . Smoking status: Never Smoker  . Smokeless tobacco: Never Used  Vaping Use  . Vaping Use: Former  Substance Use Topics  . Alcohol use: Not Currently  . Drug use: Never     Allergies   Patient has no known allergies.   Review of Systems Review of Systems See HPI  Physical Exam Triage Vital Signs ED Triage Vitals  Enc  Vitals Group     BP 10/06/20 0835 124/79     Pulse Rate 10/06/20 0835 98     Resp 10/06/20 0835 15     Temp 10/06/20 0835 98.1 F (36.7 C)     Temp Source 10/06/20 0835 Oral     SpO2 10/06/20 0835 99 %     Weight --      Height --      Head Circumference --      Peak Flow --      Pain Score 10/06/20 0834 8     Pain Loc --      Pain Edu? --      Excl. in GC? --    No data found.  Updated Vital Signs BP 124/79 (BP Location: Left Arm)   Pulse 98   Temp 98.1 F (36.7 C) (Oral)   Resp 15   LMP 09/29/2020   SpO2 99%  :     Physical Exam Constitutional:      General: She is not in acute distress.    Appearance: Normal appearance. She is well-developed.  HENT:     Head: Normocephalic and atraumatic.     Mouth/Throat:     Comments: Mask in place Eyes:  Conjunctiva/sclera: Conjunctivae normal.     Pupils: Pupils are equal, round, and reactive to light.  Cardiovascular:     Rate and Rhythm: Normal rate.  Pulmonary:     Effort: Pulmonary effort is normal. No respiratory distress.  Abdominal:     General: There is no distension.     Palpations: Abdomen is soft.  Musculoskeletal:        General: Normal range of motion.     Cervical back: Normal range of motion.     Comments: Right ankle has tenderness over the lat malleolus and the lat foot at insertion ATFL.  NO instabiity  Skin:    General: Skin is warm and dry.  Neurological:     Mental Status: She is alert.     Gait: Gait abnormal.  Psychiatric:        Behavior: Behavior normal.      UC Treatments / Results  Labs (all labs ordered are listed, but only abnormal results are displayed) Labs Reviewed - No data to display  EKG   Radiology DG Ankle Complete Right  Result Date: 10/06/2020 CLINICAL DATA:  Larey Seat last night.  Pain and swelling. EXAM: RIGHT ANKLE - COMPLETE 3+ VIEW COMPARISON:  None. FINDINGS: Lateral soft tissue swelling.  Evidence of fracture or dislocation. IMPRESSION: Lateral soft tissue  swelling. No fracture or dislocation. Electronically Signed   By: Paulina Fusi M.D.   On: 10/06/2020 09:19   DG Foot Complete Right  Result Date: 10/06/2020 CLINICAL DATA:  Larey Seat last night.  Lateral pain and swelling. EXAM: RIGHT FOOT COMPLETE - 3+ VIEW COMPARISON:  Ankle same day FINDINGS: Lateral soft tissue swelling at the foot and ankle. No evidence of fracture or dislocation. IMPRESSION: Lateral soft tissue swelling. No fracture or dislocation. Electronically Signed   By: Paulina Fusi M.D.   On: 10/06/2020 09:20    Procedures Procedures (including critical care time)  Medications Ordered in UC Medications  acetaminophen (TYLENOL) tablet 650 mg (650 mg Oral Given 10/06/20 0844)    Initial Impression / Assessment and Plan / UC Course  I have reviewed the triage vital signs and the nursing notes.  Pertinent labs & imaging results that were available during my care of the patient were reviewed by me and considered in my medical decision making (see chart for details).     No fractures.  Ankle sprain discussed.  Return as needed Final Clinical Impressions(s) / UC Diagnoses   Final diagnoses:  Acute right ankle pain  Sprain of anterior talofibular ligament of right ankle, initial encounter     Discharge Instructions     Wear brace until you can walk comfortably without a limp Stay off your foot more while you are recovering. Elevate and use ice to reduce swelling Take ibuprofen for pain Consider sports medicine for follow-up    ED Prescriptions    Medication Sig Dispense Auth. Provider   ibuprofen (ADVIL) 800 MG tablet Take 1 tablet (800 mg total) by mouth 3 (three) times daily. 21 tablet Eustace Moore, MD     PDMP not reviewed this encounter.   Eustace Moore, MD 10/06/20 1236

## 2020-10-06 NOTE — Discharge Instructions (Signed)
Wear brace until you can walk comfortably without a limp Stay off your foot more while you are recovering. Elevate and use ice to reduce swelling Take ibuprofen for pain Consider sports medicine for follow-up

## 2020-10-31 ENCOUNTER — Ambulatory Visit: Payer: Federal, State, Local not specified - PPO | Admitting: Family Medicine

## 2020-11-10 ENCOUNTER — Encounter: Payer: Self-pay | Admitting: Family Medicine

## 2020-11-10 ENCOUNTER — Ambulatory Visit (INDEPENDENT_AMBULATORY_CARE_PROVIDER_SITE_OTHER): Payer: Federal, State, Local not specified - PPO | Admitting: Family Medicine

## 2020-11-10 ENCOUNTER — Other Ambulatory Visit: Payer: Self-pay

## 2020-11-10 DIAGNOSIS — T148XXA Other injury of unspecified body region, initial encounter: Secondary | ICD-10-CM

## 2020-11-10 NOTE — Progress Notes (Signed)
Kaylee Carson - 20 y.o. female MRN [id number]  Date of birth: 10-09-2000  Subjective Chief Complaint  Patient presents with  . Establish Care    HPI Kaylee Carson is 20 y.o. female here today for initial visit.  She has been in good health.  She has no concerns today.  She did have a fall while playing football a few days ago and has some abrasions on both knees.  These seem to be healing well.  No ROM difficulty.   ROS:  A comprehensive ROS was completed and negative except as noted per HPI  No Known Allergies  Past Medical History:  Diagnosis Date  . GERD (gastroesophageal reflux disease)     Past Surgical History:  Procedure Laterality Date  . UPPER GASTROINTESTINAL ENDOSCOPY  03/05/2019  . WISDOM TOOTH EXTRACTION  01/18/2019    Social History   Socioeconomic History  . Marital status: Single    Spouse name: Not on file  . Number of children: Not on file  . Years of education: Not on file  . Highest education level: Not on file  Occupational History  . Occupation: [employer]  Tobacco Use  . Smoking status: Never Smoker  . Smokeless tobacco: Never Used  Vaping Use  . Vaping Use: Former  Substance and Sexual Activity  . Alcohol use: Not Currently  . Drug use: Never  . Sexual activity: Not Currently    Birth control/protection: Abstinence  Other Topics Concern  . Not on file  Social History Narrative  . Not on file   Social Determinants of Health   Financial Resource Strain: Not on file  Food Insecurity: Not on file  Transportation Needs: Not on file  Physical Activity: Not on file  Stress: Not on file  Social Connections: Not on file    Family History  Problem Relation Age of Onset  . Healthy Mother   . Hypertension Father   . Colon cancer Neg Hx   . Stomach cancer Neg Hx   . Rectal cancer Neg Hx   . Esophageal cancer Neg Hx     Health Maintenance  Topic Date Due  . HIV Screening  Never done  . Hepatitis C Screening  Never done  .  TETANUS/TDAP  12/19/2020  . INFLUENZA VACCINE  01/19/2021  . HPV VACCINES  Completed     ----------------------------------------------------------------------------------------------------------------------------------------------------------------------------------------------------------------- Physical Exam BP 114/67 (BP Location: Left Arm, Patient Position: Sitting, Cuff Size: Normal)   Pulse 81   Temp 97.8 F (36.6 C)   Ht 5' 9.09" (1.755 m)   Wt 172 lb (78 kg)   LMP 10/27/2020   SpO2 100%   BMI 25.33 kg/m   Physical Exam Constitutional:      Appearance: Normal appearance.  HENT:     Head: Normocephalic and atraumatic.  Cardiovascular:     Rate and Rhythm: Normal rate and regular rhythm.  Pulmonary:     Effort: Pulmonary effort is normal.     Breath sounds: Normal breath sounds.  Skin:    Comments: Scabbed over abrasions on bilateral knees.  No swelling or erythema.    Neurological:     Mental Status: She is alert.     ------------------------------------------------------------------------------------------------------------------------------------------------------------------------------------------------------------------- Assessment and Plan  Skin abrasion Healing well.  Signs of infection discussed.  Follow up as needed.    No orders of the defined types were placed in this encounter.   No follow-ups on file.    This visit occurred during the SARS-CoV-2 public health  emergency.  Safety protocols were in place, including screening questions prior to the visit, additional usage of staff PPE, and extensive cleaning of exam room while observing appropriate contact time as indicated for disinfecting solutions.

## 2020-11-10 NOTE — Assessment & Plan Note (Signed)
Healing well.  Signs of infection discussed.  Follow up as needed.

## 2020-11-23 ENCOUNTER — Encounter: Payer: Self-pay | Admitting: Family Medicine

## 2020-11-28 DIAGNOSIS — J029 Acute pharyngitis, unspecified: Secondary | ICD-10-CM | POA: Diagnosis not present

## 2020-12-18 ENCOUNTER — Encounter: Payer: Federal, State, Local not specified - PPO | Admitting: Family Medicine

## 2020-12-19 ENCOUNTER — Ambulatory Visit (INDEPENDENT_AMBULATORY_CARE_PROVIDER_SITE_OTHER): Payer: Federal, State, Local not specified - PPO | Admitting: Family Medicine

## 2020-12-19 ENCOUNTER — Encounter: Payer: Self-pay | Admitting: Family Medicine

## 2020-12-19 ENCOUNTER — Other Ambulatory Visit: Payer: Self-pay

## 2020-12-19 DIAGNOSIS — Z0000 Encounter for general adult medical examination without abnormal findings: Secondary | ICD-10-CM | POA: Insufficient documentation

## 2020-12-19 NOTE — Assessment & Plan Note (Signed)
Well adult No orders of the defined types were placed in this encounter. Screening:  She will plan to set up with Gyn in the next 6 months for initial pap.  Immunizations:  Due for Tdap but we are out of this today.  She will call back or have this completed at her pharmacy.

## 2020-12-19 NOTE — Patient Instructions (Signed)
Preventive Care 18-21 Years Old, Female Preventive care refers to lifestyle choices and visits with your health care provider that can promote health and wellness. At this stage in your life, you may start seeing a primary care physician instead of a pediatrician. It is important to take responsibility for your health and well-being. Preventive care for young adults includes: A yearly physical exam. This is also called an annual wellness visit. Regular dental and eye exams. Immunizations. Screening for certain conditions. Healthy lifestyle choices, such as: Eating a healthy diet. Getting regular exercise. Not using drugs or products that contain nicotine and tobacco. Limiting alcohol use. What can I expect for my preventive care visit? Physical exam Your health care provider may check your: Height and weight. These may be used to calculate your BMI (body mass index). BMI is a measurement that tells if you are at a healthy weight. Heart rate and blood pressure. Body temperature. Skin for abnormal spots. Counseling Your health care provider may ask you questions about your: Past medical problems. Family's medical history. Alcohol, tobacco, and drug use. Home life and relationship well-being. Access to firearms. Emotional well-being. Diet, exercise, and sleep habits. Sexual activity and sexual health. Method of birth control. Menstrual cycle. Pregnancy history. What immunizations do I need? Vaccines are usually given at various ages, according to a schedule. Your health care provider will recommend vaccines for you based on your age, medicalhistory, and lifestyle or other factors, such as travel or where you work. What tests do I need? Blood tests Lipid and cholesterol levels. These may be checked every 5 years starting at age 20. Hepatitis C test. Hepatitis B test. Screening Pelvic exam and Pap test. This may be done every 3 years starting at age 21. STD (sexually transmitted  disease) testing, if you are at risk. BRCA-related cancer screening. This may be done if you have a family history of breast, ovarian, tubal, or peritoneal cancers. Other tests Tuberculosis skin test. Vision and hearing tests. Skin exam. Breast exam. Talk with your health care provider about your test results, treatment options,and if necessary, the need for more tests. Follow these instructions at home: Eating and drinking Eat a healthy diet that includes fresh fruits and vegetables, whole grains, lean protein, and low-fat dairy products. Drink enough fluid to keep your urine pale yellow. Do not drink alcohol if: Your health care provider tells you not to drink. You are pregnant, may be pregnant, or are planning to become pregnant. You are under the legal drinking age. In the [place] the legal drinking age is 21. If you drink alcohol: Limit how much you use to 0-1 drink a day. Be aware of how much alcohol is in your drink. In the [place] one drink equals one 12 oz bottle of beer (355 mL), one 5 oz glass of wine (148 mL), or one 1 oz glass of hard liquor (44 mL).  Lifestyle Take daily care of your teeth and gums. Brush your teeth every morning and night with fluoride toothpaste. Floss one time each day. Stay active. Exercise for at least 30 minutes 5 or more days of the week. Do not use any products that contain nicotine or tobacco, such as cigarettes, e-cigarettes, and chewing tobacco. If you need help quitting, ask your health care provider. Do not use drugs. If you are sexually active, practice safe sex. Use a condom or other form of protection to prevent STIs (sexually transmitted infections). If you do not wish to become pregnant, use a form   of birth control. If you plan to become pregnant, see your health care provider for a prepregnancy visit. Find healthy ways to cope with stress, such as: Meditation, yoga, or listening to music. Journaling. Talking to a trusted person. Spending  time with friends and family. Safety Always wear your seat belt while driving or riding in a vehicle. Do not drive: If you have been drinking alcohol. Do not ride with someone who has been drinking. When you are tired or distracted. While texting. Wear a helmet and other protective equipment during sports activities. If you have firearms in your house, make sure you follow all gun safety procedures. Seek help if you have been bullied, physically abused, or sexually abused. Use the Internet responsibly to avoid dangers, such as online bullying and online sex predators. What's next? Go to your health care provider once a year for an annual wellness visit. Ask your health care provider how often you should have your eyes and teeth checked. Stay up to date on all vaccines. This information is not intended to replace advice given to you by your health care provider. Make sure you discuss any questions you have with your healthcare provider. Document Revised: 02/03/2020 Document Reviewed: 06/01/2018 Elsevier Patient Education  2022 Elsevier Inc.  

## 2020-12-19 NOTE — Progress Notes (Signed)
Kaylee Carson - 20 y.o. female MRN [id number]  Date of birth: 2001/03/08  Subjective Chief Complaint  Patient presents with   Annual Exam    HPI Kaylee Carson is a 20 y.o. female here today for annual exam.  She has been in good health and denies new concerns today.   She works at [employer] in [employer].  She enjoys her job.    She does stay pretty active and feels like her diet is healthy.   She is a non-smoker.  She does not consume EtOH.  She is not sexually active.  No signs or symptoms of STI.   She is due for Tdap Review of Systems  Constitutional:  Negative for chills, fever, malaise/fatigue and weight loss.  HENT:  Negative for congestion, ear pain and sore throat.   Eyes:  Negative for blurred vision, double vision and pain.  Respiratory:  Negative for cough and shortness of breath.   Cardiovascular:  Negative for chest pain and palpitations.  Gastrointestinal:  Negative for abdominal pain, blood in stool, constipation, heartburn and nausea.  Genitourinary:  Negative for dysuria and urgency.  Musculoskeletal:  Negative for joint pain and myalgias.  Neurological:  Negative for dizziness and headaches.  Endo/Heme/Allergies:  Does not bruise/bleed easily.  Psychiatric/Behavioral:  Negative for depression. The patient is not nervous/anxious and does not have insomnia.    No Known Allergies  Past Medical History:  Diagnosis Date   GERD (gastroesophageal reflux disease)     Past Surgical History:  Procedure Laterality Date   UPPER GASTROINTESTINAL ENDOSCOPY  03/05/2019   WISDOM TOOTH EXTRACTION  01/18/2019    Social History   Socioeconomic History   Marital status: Single    Spouse name: Not on file   Number of children: Not on file   Years of education: Not on file   Highest education level: Not on file  Occupational History   Occupation: [employer]  Tobacco Use   Smoking status: Never   Smokeless tobacco: Never  Vaping Use    Vaping Use: Former  Substance and Sexual Activity   Alcohol use: Not Currently   Drug use: Never   Sexual activity: Not Currently    Birth control/protection: Abstinence  Other Topics Concern   Not on file  Social History Narrative   Not on file   Social Determinants of Health   Financial Resource Strain: Not on file  Food Insecurity: Not on file  Transportation Needs: Not on file  Physical Activity: Not on file  Stress: Not on file  Social Connections: Not on file    Family History  Problem Relation Age of Onset   Healthy Mother    Hypertension Father    Colon cancer Neg Hx    Stomach cancer Neg Hx    Rectal cancer Neg Hx    Esophageal cancer Neg Hx     Health Maintenance  Topic Date Due   HIV Screening  Never done   Hepatitis C Screening  Never done   COVID-19 Vaccine (3 - Booster for Pfizer series) 08/20/2020   TETANUS/TDAP  12/19/2020   INFLUENZA VACCINE  01/19/2021   HPV VACCINES  Completed   Pneumococcal Vaccine 38-12 Years old  Aged Out     ----------------------------------------------------------------------------------------------------------------------------------------------------------------------------------------------------------------- Physical Exam BP 102/68 (BP Location: Left Arm, Patient Position: Sitting, Cuff Size: Normal)   Pulse 68   Temp 98.1 F (36.7 C) (Oral)   Resp 20   Ht 5' 9.09" (1.755 m)  Wt 174 lb (78.9 kg)   SpO2 100%   BMI 25.63 kg/m   Physical Exam Constitutional:      General: She is not in acute distress. HENT:     Head: Normocephalic and atraumatic.     Right Ear: Tympanic membrane and ear canal normal.     Left Ear: Tympanic membrane and ear canal normal.     Nose: Nose normal.  Eyes:     General: No scleral icterus.    Conjunctiva/sclera: Conjunctivae normal.  Neck:     Thyroid: No thyromegaly.  Cardiovascular:     Rate and Rhythm: Normal rate and regular rhythm.     Heart sounds: Normal heart sounds.   Pulmonary:     Effort: Pulmonary effort is normal.     Breath sounds: Normal breath sounds.  Abdominal:     General: Bowel sounds are normal. There is no distension.     Palpations: Abdomen is soft.     Tenderness: There is no abdominal tenderness. There is no guarding.  Musculoskeletal:        General: Normal range of motion.     Cervical back: Normal range of motion and neck supple.  Lymphadenopathy:     Cervical: No cervical adenopathy.  Skin:    General: Skin is warm and dry.     Findings: No rash.  Neurological:     General: No focal deficit present.     Mental Status: She is alert and oriented to person, place, and time.     Cranial Nerves: No cranial nerve deficit.     Coordination: Coordination normal.  Psychiatric:        Mood and Affect: Mood normal.        Behavior: Behavior normal.    ------------------------------------------------------------------------------------------------------------------------------------------------------------------------------------------------------------------- Assessment and Plan  Well adult exam Well adult No orders of the defined types were placed in this encounter. Screening:  She will plan to set up with Gyn in the next 6 months for initial pap.  Immunizations:  Due for Tdap but we are out of this today.  She will call back or have this completed at her pharmacy.    No orders of the defined types were placed in this encounter.   No follow-ups on file.    This visit occurred during the SARS-CoV-2 public health emergency.  Safety protocols were in place, including screening questions prior to the visit, additional usage of staff PPE, and extensive cleaning of exam room while observing appropriate contact time as indicated for disinfecting solutions.

## 2021-02-17 ENCOUNTER — Ambulatory Visit (INDEPENDENT_AMBULATORY_CARE_PROVIDER_SITE_OTHER): Payer: Federal, State, Local not specified - PPO | Admitting: Family Medicine

## 2021-02-17 ENCOUNTER — Other Ambulatory Visit: Payer: Self-pay

## 2021-02-17 ENCOUNTER — Encounter: Payer: Self-pay | Admitting: Family Medicine

## 2021-02-17 VITALS — BP 118/77 | HR 73 | Ht 69.0 in | Wt 182.0 lb

## 2021-02-17 DIAGNOSIS — F909 Attention-deficit hyperactivity disorder, unspecified type: Secondary | ICD-10-CM | POA: Insufficient documentation

## 2021-02-17 DIAGNOSIS — Z23 Encounter for immunization: Secondary | ICD-10-CM

## 2021-02-17 DIAGNOSIS — F900 Attention-deficit hyperactivity disorder, predominantly inattentive type: Secondary | ICD-10-CM | POA: Diagnosis not present

## 2021-02-17 MED ORDER — AMPHETAMINE-DEXTROAMPHET ER 20 MG PO CP24: 20.0000 mg | ORAL_CAPSULE | Freq: Every day | ORAL | 0 refills | Status: DC

## 2021-02-17 NOTE — Progress Notes (Signed)
Kaylee Carson - 20 y.o. female MRN [id number]  Date of birth: 2001/06/14  Subjective Chief Complaint  Patient presents with   Depression   Immunizations    HPI Kaylee Carson is a 20 year old female here today with complaint of anxiety with mild depressive symptoms.  She thinks this is related to her ADD.  She was diagnosed with ADD as a child and has been off medication for about 3 years now.  She feels anxious about ability to remain on task and oftentimes will become hyper focused on certain things and becomes more irritable when disturbed.  She was on methylphenidate CD 60 milligrams daily during her last year of high school.  She had tried Ritalin prior to this but did not like how this made her feel.  She does not feel like that methylphenidate CD really worked well for her.  She does not recall any significant side effects noted with methylphenidate previously.   ROS:  A comprehensive ROS was completed and negative except as noted per HPI  No Known Allergies  Past Medical History:  Diagnosis Date   GERD (gastroesophageal reflux disease)     Past Surgical History:  Procedure Laterality Date   UPPER GASTROINTESTINAL ENDOSCOPY  03/05/2019   WISDOM TOOTH EXTRACTION  01/18/2019    Social History   Socioeconomic History   Marital status: Single    Spouse name: Not on file   Number of children: Not on file   Years of education: Not on file   Highest education level: Not on file  Occupational History   Occupation: [employer]  Tobacco Use   Smoking status: Never   Smokeless tobacco: Never  Vaping Use   Vaping Use: Former  Substance and Sexual Activity   Alcohol use: Not Currently   Drug use: Never   Sexual activity: Not Currently    Birth control/protection: Abstinence  Other Topics Concern   Not on file  Social History Narrative   Not on file   Social Determinants of Health   Financial Resource Strain: Not on file  Food Insecurity: Not on file  Transportation  Needs: Not on file  Physical Activity: Not on file  Stress: Not on file  Social Connections: Not on file    Family History  Problem Relation Age of Onset   Healthy Mother    Hypertension Father    Colon cancer Neg Hx    Stomach cancer Neg Hx    Rectal cancer Neg Hx    Esophageal cancer Neg Hx     Health Maintenance  Topic Date Due   HIV Screening  Never done   Hepatitis C Screening  Never done   COVID-19 Vaccine (3 - Booster for Pfizer series) 08/20/2020   PAP-Cervical Cytology Screening  12/31/2020   PAP SMEAR-Modifier  12/31/2020   TETANUS/TDAP  02/18/2031   Pneumococcal Vaccine 21-78 Years old  Completed   INFLUENZA VACCINE  Completed   HPV VACCINES  Completed     ----------------------------------------------------------------------------------------------------------------------------------------------------------------------------------------------------------------- Physical Exam BP 118/77 (BP Location: Left Arm, Patient Position: Sitting, Cuff Size: Normal)   Pulse 73   Ht 5\' 9"  (1.753 m)   Wt 182 lb (82.6 kg)   SpO2 97%   BMI 26.88 kg/m   Physical Exam Constitutional:      Appearance: Normal appearance.  Eyes:     General: No scleral icterus. Cardiovascular:     Rate and Rhythm: Normal rate and regular rhythm.  Pulmonary:     Effort: Pulmonary effort is normal.  Breath sounds: Normal breath sounds.  Musculoskeletal:     Cervical back: Neck supple.  Skin:    General: Skin is warm and dry.  Neurological:     General: No focal deficit present.     Mental Status: She is alert.  Psychiatric:        Mood and Affect: Mood normal.        Behavior: Behavior normal.    ------------------------------------------------------------------------------------------------------------------------------------------------------------------------------------------------------------------- Assessment and Plan  ADHD Records reviewed through care everywhere from  her previous pediatrician confirming previous diagnosis of ADD.  I think her untreated ADD is contributing to heranxiety symptoms.  She did not do very well with methylphenidate previously.  We will try Adderall XR 20 mg daily initially.  I will plan to see her back in 1 month we can make adjustments as needed.   Meds ordered this encounter  Medications   amphetamine-dextroamphetamine (ADDERALL XR) 20 MG 24 hr capsule    Sig: Take 1 capsule (20 mg total) by mouth daily.    Dispense:  30 capsule    Refill:  0    Return in about 4 weeks (around 03/17/2021) for ADD.    This visit occurred during the SARS-CoV-2 public health emergency.  Safety protocols were in place, including screening questions prior to the visit, additional usage of staff PPE, and extensive cleaning of exam room while observing appropriate contact time as indicated for disinfecting solutions.

## 2021-02-17 NOTE — Patient Instructions (Signed)
Great to see you today! Try adderall XR 20mg  daily.   Follow up in 1 month.

## 2021-02-17 NOTE — Assessment & Plan Note (Signed)
Records reviewed through care everywhere from her previous pediatrician confirming previous diagnosis of ADD.  I think her untreated ADD is contributing to heranxiety symptoms.  She did not do very well with methylphenidate previously.  We will try Adderall XR 20 mg daily initially.  I will plan to see her back in 1 month we can make adjustments as needed.

## 2021-03-17 ENCOUNTER — Encounter: Payer: Self-pay | Admitting: Family Medicine

## 2021-03-17 ENCOUNTER — Ambulatory Visit (INDEPENDENT_AMBULATORY_CARE_PROVIDER_SITE_OTHER): Payer: Federal, State, Local not specified - PPO | Admitting: Family Medicine

## 2021-03-17 DIAGNOSIS — F900 Attention-deficit hyperactivity disorder, predominantly inattentive type: Secondary | ICD-10-CM

## 2021-03-17 MED ORDER — LISDEXAMFETAMINE DIMESYLATE 30 MG PO CAPS: 30.0000 mg | ORAL_CAPSULE | Freq: Every day | ORAL | 0 refills | Status: DC

## 2021-03-17 NOTE — Assessment & Plan Note (Signed)
We discussed alternatives for management of her ADHD.  Starting Vyvanse at 30 mg daily.  She will let me know how this is working for her after the next 4 to 6 weeks.

## 2021-03-17 NOTE — Patient Instructions (Signed)
Discontinue adderall xr.  Start vyvanse.  Let me know how things are going after about 1 month

## 2021-03-17 NOTE — Progress Notes (Signed)
Kaylee Carson - 20 y.o. female MRN [id number]  Date of birth: Oct 18, 2000  Subjective Chief Complaint  Patient presents with   Follow-up    HPI Kaylee Carson is a 20 year old female here today for follow-up of ADHD.  She was started on Adderall XR 20 mg at previous visit.  This has helped with concentration focus however she has been more irritable since starting this.  She was previously on methylphenidate in did not feel that this was effective for symptom management.  She does not recall side effects with this.  She is interested in trying something different for treatment of her ADHD.  ROS:  A comprehensive ROS was completed and negative except as noted per HPI  No Known Allergies  Past Medical History:  Diagnosis Date   GERD (gastroesophageal reflux disease)     Past Surgical History:  Procedure Laterality Date   UPPER GASTROINTESTINAL ENDOSCOPY  03/05/2019   WISDOM TOOTH EXTRACTION  01/18/2019    Social History   Socioeconomic History   Marital status: Single    Spouse name: Not on file   Number of children: Not on file   Years of education: Not on file   Highest education level: Not on file  Occupational History   Occupation: [employer]  Tobacco Use   Smoking status: Never   Smokeless tobacco: Never  Vaping Use   Vaping Use: Former  Substance and Sexual Activity   Alcohol use: Not Currently   Drug use: Never   Sexual activity: Not Currently    Birth control/protection: Abstinence  Other Topics Concern   Not on file  Social History Narrative   Not on file   Social Determinants of Health   Financial Resource Strain: Not on file  Food Insecurity: Not on file  Transportation Needs: Not on file  Physical Activity: Not on file  Stress: Not on file  Social Connections: Not on file    Family History  Problem Relation Age of Onset   Healthy Mother    Hypertension Father    Colon cancer Neg Hx    Stomach cancer Neg Hx    Rectal cancer Neg Hx     Esophageal cancer Neg Hx     Health Maintenance  Topic Date Due   HIV Screening  Never done   Hepatitis C Screening  Never done   COVID-19 Vaccine (3 - Booster for Pfizer series) 08/20/2020   PAP-Cervical Cytology Screening  Never done   PAP SMEAR-Modifier  Never done   TETANUS/TDAP  02/18/2031   INFLUENZA VACCINE  Completed   HPV VACCINES  Completed     ----------------------------------------------------------------------------------------------------------------------------------------------------------------------------------------------------------------- Physical Exam BP 116/72 (BP Location: Left Arm, Patient Position: Sitting, Cuff Size: Normal)   Pulse 72   Ht 5\' 9"  (1.753 m)   Wt 174 lb (78.9 kg)   SpO2 98%   BMI 25.70 kg/m   Physical Exam Constitutional:      Appearance: Normal appearance.  HENT:     Head: Normocephalic and atraumatic.  Cardiovascular:     Rate and Rhythm: Normal rate and regular rhythm.  Pulmonary:     Effort: Pulmonary effort is normal.     Breath sounds: Normal breath sounds.  Musculoskeletal:     Cervical back: Neck supple.  Neurological:     Mental Status: She is alert.  Psychiatric:        Mood and Affect: Mood normal.        Behavior: Behavior normal.    ------------------------------------------------------------------------------------------------------------------------------------------------------------------------------------------------------------------- Assessment and  Plan  ADHD We discussed alternatives for management of her ADHD.  Starting Vyvanse at 30 mg daily.  She will let me know how this is working for her after the next 4 to 6 weeks.   Meds ordered this encounter  Medications   lisdexamfetamine (VYVANSE) 30 MG capsule    Sig: Take 1 capsule (30 mg total) by mouth daily.    Dispense:  30 capsule    Refill:  0    No follow-ups on file.    This visit occurred during the SARS-CoV-2 public health emergency.   Safety protocols were in place, including screening questions prior to the visit, additional usage of staff PPE, and extensive cleaning of exam room while observing appropriate contact time as indicated for disinfecting solutions.

## 2021-04-27 ENCOUNTER — Other Ambulatory Visit: Payer: Self-pay | Admitting: Family Medicine

## 2021-04-27 ENCOUNTER — Encounter: Payer: Self-pay | Admitting: Family Medicine

## 2021-04-27 MED ORDER — LISDEXAMFETAMINE DIMESYLATE 30 MG PO CAPS
30.0000 mg | ORAL_CAPSULE | Freq: Every day | ORAL | 0 refills | Status: DC
Start: 2021-04-27 — End: 2021-07-28

## 2021-07-28 ENCOUNTER — Encounter: Payer: Self-pay | Admitting: Family Medicine

## 2021-07-28 ENCOUNTER — Other Ambulatory Visit: Payer: Self-pay | Admitting: Family Medicine

## 2021-07-28 MED ORDER — LISDEXAMFETAMINE DIMESYLATE 30 MG PO CAPS
30.0000 mg | ORAL_CAPSULE | Freq: Every day | ORAL | 0 refills | Status: DC
  Filled 2021-07-28: qty 30, 30d supply, fill #0

## 2021-07-29 ENCOUNTER — Other Ambulatory Visit (HOSPITAL_COMMUNITY): Payer: Self-pay

## 2021-07-29 MED ORDER — LISDEXAMFETAMINE DIMESYLATE 30 MG PO CAPS: 30.0000 mg | ORAL_CAPSULE | Freq: Every day | ORAL | 0 refills | Status: DC

## 2021-07-29 NOTE — Telephone Encounter (Signed)
Rx was sent yesterday.

## 2021-07-29 NOTE — Telephone Encounter (Signed)
Rx updated.  Thanks!

## 2021-07-30 ENCOUNTER — Emergency Department (INDEPENDENT_AMBULATORY_CARE_PROVIDER_SITE_OTHER): Payer: Federal, State, Local not specified - PPO

## 2021-07-30 ENCOUNTER — Emergency Department (INDEPENDENT_AMBULATORY_CARE_PROVIDER_SITE_OTHER)
Admission: EM | Admit: 2021-07-30 | Discharge: 2021-07-30 | Disposition: A | Discharge status: Home / Self Care | Payer: Federal, State, Local not specified - PPO | Source: Home / Self Care | Attending: Family Medicine | Admitting: Family Medicine

## 2021-07-30 ENCOUNTER — Encounter: Payer: Self-pay | Admitting: Emergency Medicine

## 2021-07-30 ENCOUNTER — Other Ambulatory Visit: Payer: Self-pay

## 2021-07-30 DIAGNOSIS — R0789 Other chest pain: Secondary | ICD-10-CM | POA: Diagnosis not present

## 2021-07-30 DIAGNOSIS — R079 Chest pain, unspecified: Secondary | ICD-10-CM | POA: Diagnosis not present

## 2021-07-30 DIAGNOSIS — R0602 Shortness of breath: Secondary | ICD-10-CM

## 2021-07-30 NOTE — ED Provider Notes (Signed)
[facility]    CSN: [id number] Arrival date & time: 07/30/21  0941      History   Chief Complaint Chief Complaint  Patient presents with   Chest Pain    HPI Kaylee Carson is a 21 y.o. female.   HPI  Patient has had chest pain intermittently for the last couple of weeks.  She points to her midsternal region.  She cannot predict the pain.  It appears to be random.  It happens day and night.  She does not have shortness of breath.  She does not have cough or sputum production. She has no history of heart disease, hypertension, diabetes, cigarette smoking.  No underlying lung disease or asthma Both her father and grandfather had heart disease under the age of 46 She is on no medications.  No birth control.  Past Medical History:  Diagnosis Date   GERD (gastroesophageal reflux disease)     Patient Active Problem List   Diagnosis Date Noted   ADHD 02/17/2021   Well adult exam 12/19/2020   Skin abrasion 11/10/2020    Past Surgical History:  Procedure Laterality Date   UPPER GASTROINTESTINAL ENDOSCOPY  03/05/2019   WISDOM TOOTH EXTRACTION  01/18/2019    OB History   No obstetric history on file.      Home Medications    Prior to Admission medications   Medication Sig Start Date End Date Taking? Authorizing Provider  lisdexamfetamine (VYVANSE) 30 MG capsule Take 1 capsule (30 mg total) by mouth daily. 07/29/21   Luetta Nutting, DO  fluticasone (FLONASE) 50 MCG/ACT nasal spray Place 2 sprays into both nostrils daily. 02/21/20 10/06/20  Noe Gens, PA-C  pantoprazole (PROTONIX) 40 MG tablet Protonix 40 mg twice daily for 6 weeks, then reduce to 40 mg daily for control of reflux. 01/24/19 10/06/20  Cirigliano, Dominic Pea, DO    Family History Family History  Problem Relation Age of Onset   Healthy Mother    Hypertension Father    Colon cancer Neg Hx    Stomach cancer Neg Hx    Rectal cancer Neg Hx    Esophageal cancer Neg Hx     Social History Social  History   Tobacco Use   Smoking status: Never   Smokeless tobacco: Never  Vaping Use   Vaping Use: Former  Substance Use Topics   Alcohol use: Not Currently   Drug use: Never     Allergies   Patient has no known allergies.   Review of Systems Review of Systems See HPI  Physical Exam Triage Vital Signs ED Triage Vitals  Enc Vitals Group     BP 07/30/21 1016 121/76     Pulse Rate 07/30/21 1016 86     Resp 07/30/21 1016 16     Temp 07/30/21 1016 98.4 F (36.9 C)     Temp Source 07/30/21 1016 Oral     SpO2 07/30/21 1016 98 %     Weight 07/30/21 1017 175 lb (79.4 kg)     Height 07/30/21 1017 5\' 9"  (1.753 m)     Head Circumference --      Peak Flow --      Pain Score 07/30/21 1016 7     Pain Loc --      Pain Edu? --      Excl. in [place]? --    No data found.  Updated Vital Signs BP 121/76 (BP Location: Left Arm)    Pulse 86  Temp 98.4 F (36.9 C) (Oral)    Resp 16    Ht 5\' 9"  (1.753 m)    Wt 79.4 kg    SpO2 98%    BMI 25.84 kg/m       Physical Exam Constitutional:      General: She is not in acute distress.    Appearance: She is well-developed.  HENT:     Head: Normocephalic and atraumatic.  Eyes:     Conjunctiva/sclera: Conjunctivae normal.     Pupils: Pupils are equal, round, and reactive to light.  Cardiovascular:     Rate and Rhythm: Normal rate and regular rhythm.     Heart sounds: Normal heart sounds.  Pulmonary:     Effort: Pulmonary effort is normal. No respiratory distress.     Breath sounds: Normal breath sounds.     Comments: Tenderness at the left sternal border rib 2 Chest:     Chest wall: Tenderness present.  Abdominal:     General: There is no distension.     Palpations: Abdomen is soft.  Musculoskeletal:        General: Normal range of motion.     Cervical back: Normal range of motion.  Skin:    General: Skin is warm and dry.  Neurological:     Mental Status: She is alert.     UC Treatments / Results  Labs (all labs ordered  are listed, but only abnormal results are displayed) Labs Reviewed - No data to display  EKG   Radiology DG Chest 2 View  Result Date: 07/30/2021 CLINICAL DATA:  Chest pain and shortness of breath EXAM: CHEST - 2 VIEW COMPARISON:  None. FINDINGS: The heart size and mediastinal contours are within normal limits. Both lungs are clear. The visualized skeletal structures are unremarkable. IMPRESSION: No active cardiopulmonary disease. Electronically Signed   By: Davina Poke D.O.   On: 07/30/2021 11:19    Procedures Procedures (including critical care time)  Medications Ordered in UC Medications - No data to display  Initial Impression / Assessment and Plan / UC Course  I have reviewed the triage vital signs and the nursing notes.  Pertinent labs & imaging results that were available during my care of the patient were reviewed by me and considered in my medical decision making (see chart for details).     By exam patient appears to have chest wall pain.  EKG is normal.  Chest x-ray is normal.  Does not have GI distress or GERD symptoms at this time.  Discussed differential diagnosis of chest pain.  Reasons for follow-up Final Clinical Impressions(s) / UC Diagnoses   Final diagnoses:  Chest pressure  Chest wall pain     Discharge Instructions      Take Aleve 2 pills in the morning and 2 pills at night with food I believe that your chest pain is from the rib cage Watch for stomach upset.  Take antacids if needed See your primary care doctor in follow-up   ED Prescriptions   None    PDMP not reviewed this encounter.   Raylene Everts, MD 07/30/21 [phone]

## 2021-07-30 NOTE — ED Triage Notes (Signed)
Chest pressure x 2 weeks, shooting sensation, SOB, worse last night. Vaccinated for Covid Flu shot

## 2021-07-30 NOTE — Discharge Instructions (Addendum)
Take Aleve 2 pills in the morning and 2 pills at night with food I believe that your chest pain is from the rib cage Watch for stomach upset.  Take antacids if needed See your primary care doctor in follow-up

## 2021-08-05 ENCOUNTER — Ambulatory Visit: Payer: Federal, State, Local not specified - PPO | Admitting: Family Medicine

## 2021-08-05 ENCOUNTER — Other Ambulatory Visit: Payer: Self-pay

## 2021-08-05 ENCOUNTER — Encounter: Payer: Self-pay | Admitting: Family Medicine

## 2021-08-05 DIAGNOSIS — L309 Dermatitis, unspecified: Secondary | ICD-10-CM | POA: Diagnosis not present

## 2021-08-05 DIAGNOSIS — R0789 Other chest pain: Secondary | ICD-10-CM | POA: Insufficient documentation

## 2021-08-05 MED ORDER — PREDNISONE 10 MG (48) PO TBPK: ORAL_TABLET | ORAL | 0 refills | Status: DC

## 2021-08-05 MED ORDER — TRIAMCINOLONE ACETONIDE 0.1 % EX CREA: 1.0000 "application " | TOPICAL_CREAM | Freq: Two times a day (BID) | CUTANEOUS | 0 refills | Status: DC

## 2021-08-05 NOTE — Assessment & Plan Note (Signed)
Adding triamcinolone as needed.

## 2021-08-05 NOTE — Progress Notes (Signed)
Kaylee Carson - 21 y.o. female MRN [id number]  Date of birth: [birth year]  Subjective Chief Complaint  Patient presents with   Chest Pain    HPI Kaylee Carson is a 21 year old female here today for follow-up from recent urgent care visit.  She was seen at urgent care with complaint of chest pain.  Pain is located along the chest wall.  She has tried Aleve without much relief.  Pain is nonexertional.  She does have some increase in pain when she takes a deep breath.  She denies shortness of breath.  She is a non-smoker and she is not currently on birth control.  She has had itchy rash on her hands.  She has not tried anything on this so far.  ROS:  A comprehensive ROS was completed and negative except as noted per HPI  No Known Allergies  Past Medical History:  Diagnosis Date   GERD (gastroesophageal reflux disease)     Past Surgical History:  Procedure Laterality Date   UPPER GASTROINTESTINAL ENDOSCOPY  03/05/2019   WISDOM TOOTH EXTRACTION  01/18/2019    Social History   Socioeconomic History   Marital status: Single    Spouse name: Not on file   Number of children: Not on file   Years of education: Not on file   Highest education level: Not on file  Occupational History   Occupation: [employer]  Tobacco Use   Smoking status: Never   Smokeless tobacco: Never  Vaping Use   Vaping Use: Former  Substance and Sexual Activity   Alcohol use: Not Currently   Drug use: Never   Sexual activity: Not Currently    Birth control/protection: Abstinence  Other Topics Concern   Not on file  Social History Narrative   Not on file   Social Determinants of Health   Financial Resource Strain: Not on file  Food Insecurity: Not on file  Transportation Needs: Not on file  Physical Activity: Not on file  Stress: Not on file  Social Connections: Not on file    Family History  Problem Relation Age of Onset   Healthy Mother    Hypertension Father    Colon cancer Neg Hx     Stomach cancer Neg Hx    Rectal cancer Neg Hx    Esophageal cancer Neg Hx     Health Maintenance  Topic Date Due   HIV Screening  Never done   Hepatitis C Screening  Never done   PAP-Cervical Cytology Screening  Never done   PAP SMEAR-Modifier  Never done   COVID-19 Vaccine (3 - Booster for [place] series) 08/22/2022 (Originally 05/17/2020)   TETANUS/TDAP  02/18/2031   INFLUENZA VACCINE  Completed   HPV VACCINES  Completed     ----------------------------------------------------------------------------------------------------------------------------------------------------------------------------------------------------------------- Physical Exam BP 136/76 (BP Location: Left Arm, Patient Position: Sitting, Cuff Size: Normal)    Pulse 89    Temp (!) 97.5 F (36.4 C)    Ht 5\' 9"  (1.753 m)    Wt 176 lb (79.8 kg)    SpO2 99%    BMI 25.99 kg/m   Physical Exam Constitutional:      Appearance: Normal appearance.  Eyes:     General: No scleral icterus. Cardiovascular:     Rate and Rhythm: Normal rate and regular rhythm.     Comments: Tenderness to palpation along the left sternal border around second and third ribs. Pulmonary:     Effort: Pulmonary effort is normal.     Breath sounds: Normal  breath sounds.  Musculoskeletal:     Cervical back: Neck supple.  Skin:    Comments: Dry, scaly rash on erythematous base along bilateral hands.  Neurological:     Mental Status: She is alert.  Psychiatric:        Mood and Affect: Mood normal.        Behavior: Behavior normal.    ------------------------------------------------------------------------------------------------------------------------------------------------------------------------------------------------------------------- Assessment and Plan  Chest wall pain Symptoms seem to be musculoskeletal in nature.  Adding prednisone taper recommend using heat to this area.  She will let me know if not improving or if she is having  new or worsening symptoms.  Eczema Adding triamcinolone as needed.   Meds ordered this encounter  Medications   predniSONE (STERAPRED UNI-PAK 48 TAB) 10 MG (48) TBPK tablet    Sig: Taper as directed on packaging    Dispense:  48 tablet    Refill:  0   triamcinolone cream (KENALOG) 0.1 %    Sig: Apply 1 application topically 2 (two) times daily. Apply as needed to hands    Dispense:  30 g    Refill:  0    No follow-ups on file.    This visit occurred during the SARS-CoV-2 public health emergency.  Safety protocols were in place, including screening questions prior to the visit, additional usage of staff PPE, and extensive cleaning of exam room while observing appropriate contact time as indicated for disinfecting solutions.

## 2021-08-05 NOTE — Patient Instructions (Signed)
Try adding Cerave lotion daily.  You may use triamcinolone as needed as well.   Start prednisone for rib pain.  Stay well hydrated.  Adding a vitamin d supplement may be helpful as well (2000 units daily) Try heating pad as needed.   Costochondritis Costochondritis is irritation and swelling (inflammation) of the tissue that connects the ribs to the breastbone (sternum). This tissue is called cartilage. Costochondritis causes pain in the front of the chest. Usually, the pain: Starts slowly. Is in more than one rib. What are the causes? The exact cause of this condition is not always known. It results from stress on the tissue in the affected area. The cause of this stress could be: Chest injury. Exercise or activity, such as lifting. Very bad coughing. What increases the risk? You are more likely to develop this condition if you: Are female. Are 97-62 years old. Recently started a new exercise or work activity. Have low levels of vitamin D. Have a condition that makes you cough often. What are the signs or symptoms? The main symptom of this condition is chest pain. The pain: Usually starts slowly and can be sharp or dull. Gets worse with deep breathing, coughing, or exercise. Gets better with rest. May be worse when you press on the affected area of your ribs and breastbone. How is this treated? This condition usually goes away on its own over time. Your doctor may prescribe an NSAID, such as ibuprofen. This can help reduce pain and inflammation. Treatment may also include: Resting and avoiding activities that make pain worse. Putting heat or ice on the painful area. Doing exercises to stretch your chest muscles. If these treatments do not help, your doctor may inject a numbing medicine to help relieve the pain. Follow these instructions at home: Managing pain, stiffness, and swelling   If told, put ice on the painful area. To do this: Put ice in a plastic bag. Place a towel  between your skin and the bag. Leave the ice on for 20 minutes, 2-3 times a day. If told, put heat on the affected area. Do this as often as told by your doctor. Use the heat source that your doctor recommends, such as a moist heat pack or a heating pad. Place a towel between your skin and the heat source. Leave the heat on for 20-30 minutes. Take off the heat if your skin turns bright red. This is very important if you cannot feel pain, heat, or cold. You may have a greater risk of getting burned. Activity Rest as told by your doctor. Do not do anything that makes your pain worse. This includes any activities that use chest, belly (abdomen), and side muscles. Do not lift anything that is heavier than 10 lb (4.5 kg), or the limit that you are told, until your doctor says that it is safe. Return to your normal activities as told by your doctor. Ask your doctor what activities are safe for you. General instructions Take over-the-counter and prescription medicines only as told by your doctor. Keep all follow-up visits as told by your doctor. This is important. Contact a doctor if: You have chills or a fever. Your pain does not go away or it gets worse. You have a cough that does not go away. Get help right away if: You are short of breath. You have very bad chest pain that is not helped by medicines, heat, or ice. These symptoms may be an emergency. Do not wait to see if  the symptoms will go away. Get medical help right away. Call your local emergency services (911 in the [place] Do not drive yourself to the hospital. Summary Costochondritis is irritation and swelling (inflammation) of the tissue that connects the ribs to the breastbone (sternum). This condition causes pain in the front of the chest. Treatment may include medicines, rest, heat or ice, and exercises. This information is not intended to replace advice given to you by your health care provider. Make sure you discuss any questions  you have with your health care provider. Document Revised: 04/20/2019 Document Reviewed: 04/20/2019 Elsevier Patient Education  2022 [employer].

## 2021-08-05 NOTE — Assessment & Plan Note (Signed)
Symptoms seem to be musculoskeletal in nature.  Adding prednisone taper recommend using heat to this area.  She will let me know if not improving or if she is having new or worsening symptoms.

## 2021-08-23 ENCOUNTER — Encounter: Payer: Self-pay | Admitting: Family Medicine

## 2021-09-11 ENCOUNTER — Other Ambulatory Visit: Payer: Self-pay | Admitting: Family Medicine

## 2021-09-11 MED ORDER — LISDEXAMFETAMINE DIMESYLATE 30 MG PO CAPS: 30.0000 mg | ORAL_CAPSULE | Freq: Every day | ORAL | 0 refills | Status: DC

## 2021-10-14 DIAGNOSIS — F4320 Adjustment disorder, unspecified: Secondary | ICD-10-CM | POA: Diagnosis not present

## 2021-10-17 ENCOUNTER — Other Ambulatory Visit: Payer: Self-pay | Admitting: Family Medicine

## 2021-10-19 ENCOUNTER — Other Ambulatory Visit: Payer: Self-pay | Admitting: Family Medicine

## 2021-10-19 ENCOUNTER — Encounter: Payer: Self-pay | Admitting: Family Medicine

## 2021-10-21 DIAGNOSIS — F4320 Adjustment disorder, unspecified: Secondary | ICD-10-CM | POA: Diagnosis not present

## 2021-10-30 ENCOUNTER — Ambulatory Visit: Payer: Federal, State, Local not specified - PPO | Admitting: Family Medicine

## 2021-11-02 ENCOUNTER — Other Ambulatory Visit: Payer: Self-pay | Admitting: Family Medicine

## 2021-11-02 MED ORDER — LISDEXAMFETAMINE DIMESYLATE 30 MG PO CAPS: 30.0000 mg | ORAL_CAPSULE | Freq: Every day | ORAL | 0 refills | Status: DC

## 2021-11-02 NOTE — Telephone Encounter (Signed)
Patient is scheduled for 6/6 and would like a refill of Vyvanse until the appt.  ?

## 2021-11-04 DIAGNOSIS — F4320 Adjustment disorder, unspecified: Secondary | ICD-10-CM | POA: Diagnosis not present

## 2021-11-04 NOTE — Telephone Encounter (Signed)
Notified patient.

## 2021-11-04 NOTE — Telephone Encounter (Signed)
Dr. Ashley Royalty sent refill on 11/02/21. ? ?Thanks ?

## 2021-11-19 DIAGNOSIS — F4320 Adjustment disorder, unspecified: Secondary | ICD-10-CM | POA: Diagnosis not present

## 2021-11-24 ENCOUNTER — Encounter: Payer: Self-pay | Admitting: Family Medicine

## 2021-11-24 ENCOUNTER — Telehealth (INDEPENDENT_AMBULATORY_CARE_PROVIDER_SITE_OTHER): Payer: Federal, State, Local not specified - PPO | Admitting: Family Medicine

## 2021-11-24 DIAGNOSIS — F900 Attention-deficit hyperactivity disorder, predominantly inattentive type: Secondary | ICD-10-CM | POA: Diagnosis not present

## 2021-11-24 MED ORDER — LISDEXAMFETAMINE DIMESYLATE 40 MG PO CAPS: 40.0000 mg | ORAL_CAPSULE | ORAL | 0 refills | Status: DC

## 2021-11-24 NOTE — Progress Notes (Signed)
Kaylee Carson - 21 y.o. female MRN [id number]  Date of birth: [birth year]   This visit type was conducted due to national recommendations for restrictions regarding the COVID-19 Pandemic (e.g. social distancing).  This format is felt to be most appropriate for this patient at this time.  All issues noted in this document were discussed and addressed.  No physical exam was performed (except for noted visual exam findings with Video Visits).  I discussed the limitations of evaluation and management by telemedicine and the availability of in person appointments. The patient expressed understanding and agreed to proceed.  I connected withNAME@ on 11/24/21 at  2:50 PM EDT by a video enabled telemedicine application and verified that I am speaking with the correct person using two identifiers.  Present at visit: Kaylee Nutting, DO Judeen Hammans   Patient Location: [place] [address]   Provider location:   [place]  No chief complaint on file.   HPI  Kaylee Carson is a 21 y.o. female who presents via audio/video conferencing for a telehealth visit today.  She is following up today for ADHD.  She has been treated with Vyvanse 30mg .  This has been helpful but feels like she needs to go up on dosing.  She has not had side effects with the 30mg  dose.  Denies insomnia, appetite change, headache.    ROS:  A comprehensive ROS was completed and negative except as noted per HPI  Past Medical History:  Diagnosis Date   GERD (gastroesophageal reflux disease)     Past Surgical History:  Procedure Laterality Date   UPPER GASTROINTESTINAL ENDOSCOPY  03/05/2019   WISDOM TOOTH EXTRACTION  01/18/2019    Family History  Problem Relation Age of Onset   Healthy Mother    Hypertension Father    Colon cancer Neg Hx    Stomach cancer Neg Hx    Rectal cancer Neg Hx    Esophageal cancer Neg Hx     Social History   Socioeconomic History   Marital status: Single     Spouse name: Not on file   Number of children: Not on file   Years of education: Not on file   Highest education level: Not on file  Occupational History   Occupation: [employer]  Tobacco Use   Smoking status: Never   Smokeless tobacco: Never  Vaping Use   Vaping Use: Former  Substance and Sexual Activity   Alcohol use: Not Currently   Drug use: Never   Sexual activity: Not Currently    Birth control/protection: Abstinence  Other Topics Concern   Not on file  Social History Narrative   Not on file   Social Determinants of Health   Financial Resource Strain: Not on file  Food Insecurity: Not on file  Transportation Needs: Not on file  Physical Activity: Not on file  Stress: Not on file  Social Connections: Not on file  Intimate Partner Violence: Not on file     Current Outpatient Medications:    lisdexamfetamine (VYVANSE) 40 MG capsule, Take 1 capsule (40 mg total) by mouth every morning., Disp: 15 capsule, Rfl: 0   triamcinolone cream (KENALOG) 0.1 %, Apply 1 application topically 2 (two) times daily. Apply as needed to hands, Disp: 30 g, Rfl: 0  EXAM:  VITALS per patient if applicable: Ht 5\' 9"  (1.753 m)   Wt 170 lb (77.1 kg)   BMI 25.10 kg/m   GENERAL: alert, oriented, appears well and  in no acute distress  HEENT: atraumatic, conjunttiva clear, no obvious abnormalities on inspection of external nose and ears  NECK: normal movements of the head and neck  LUNGS: on inspection no signs of respiratory distress, breathing rate appears normal, no obvious gross SOB, gasping or wheezing  CV: no obvious cyanosis  MS: moves all visible extremities without noticeable abnormality  PSYCH/NEURO: pleasant and cooperative, no obvious depression or anxiety, speech and thought processing grossly intact  ASSESSMENT AND PLAN:  Discussed the following assessment and plan:  ADHD Feels like she needs to increase strength of medication.  2 weeks supply of 40mg  sent  in and she will let me know how things are going with this.  If doing well will send in full rx.   We'll plan for routine follow up in 6 months.      I discussed the assessment and treatment plan with the patient. The patient was provided an opportunity to ask questions and all were answered. The patient agreed with the plan and demonstrated an understanding of the instructions.   The patient was advised to call back or seek an in-person evaluation if the symptoms worsen or if the condition fails to improve as anticipated.    Kaylee Nutting, DO

## 2021-11-24 NOTE — Assessment & Plan Note (Signed)
Feels like she needs to increase strength of medication.  2 weeks supply of 40mg  sent in and she will let me know how things are going with this.  If doing well will send in full rx.   We'll plan for routine follow up in 6 months.

## 2021-11-27 DIAGNOSIS — F4320 Adjustment disorder, unspecified: Secondary | ICD-10-CM | POA: Diagnosis not present

## 2021-12-02 DIAGNOSIS — F4320 Adjustment disorder, unspecified: Secondary | ICD-10-CM | POA: Diagnosis not present

## 2021-12-16 DIAGNOSIS — F4320 Adjustment disorder, unspecified: Secondary | ICD-10-CM | POA: Diagnosis not present

## 2021-12-18 ENCOUNTER — Other Ambulatory Visit: Payer: Self-pay | Admitting: Family Medicine

## 2021-12-18 MED ORDER — LISDEXAMFETAMINE DIMESYLATE 40 MG PO CAPS: 40.0000 mg | ORAL_CAPSULE | ORAL | 0 refills | Status: DC

## 2021-12-19 IMAGING — DX DG FOOT COMPLETE 3+V*R*
3 series · 3 of 3 positions shown · non-contrast
Comparison: Ankle same day

CLINICAL DATA: Fell last night.  Lateral pain and swelling.

EXAM:
RIGHT FOOT COMPLETE - 3+ VIEW

[foot ap]
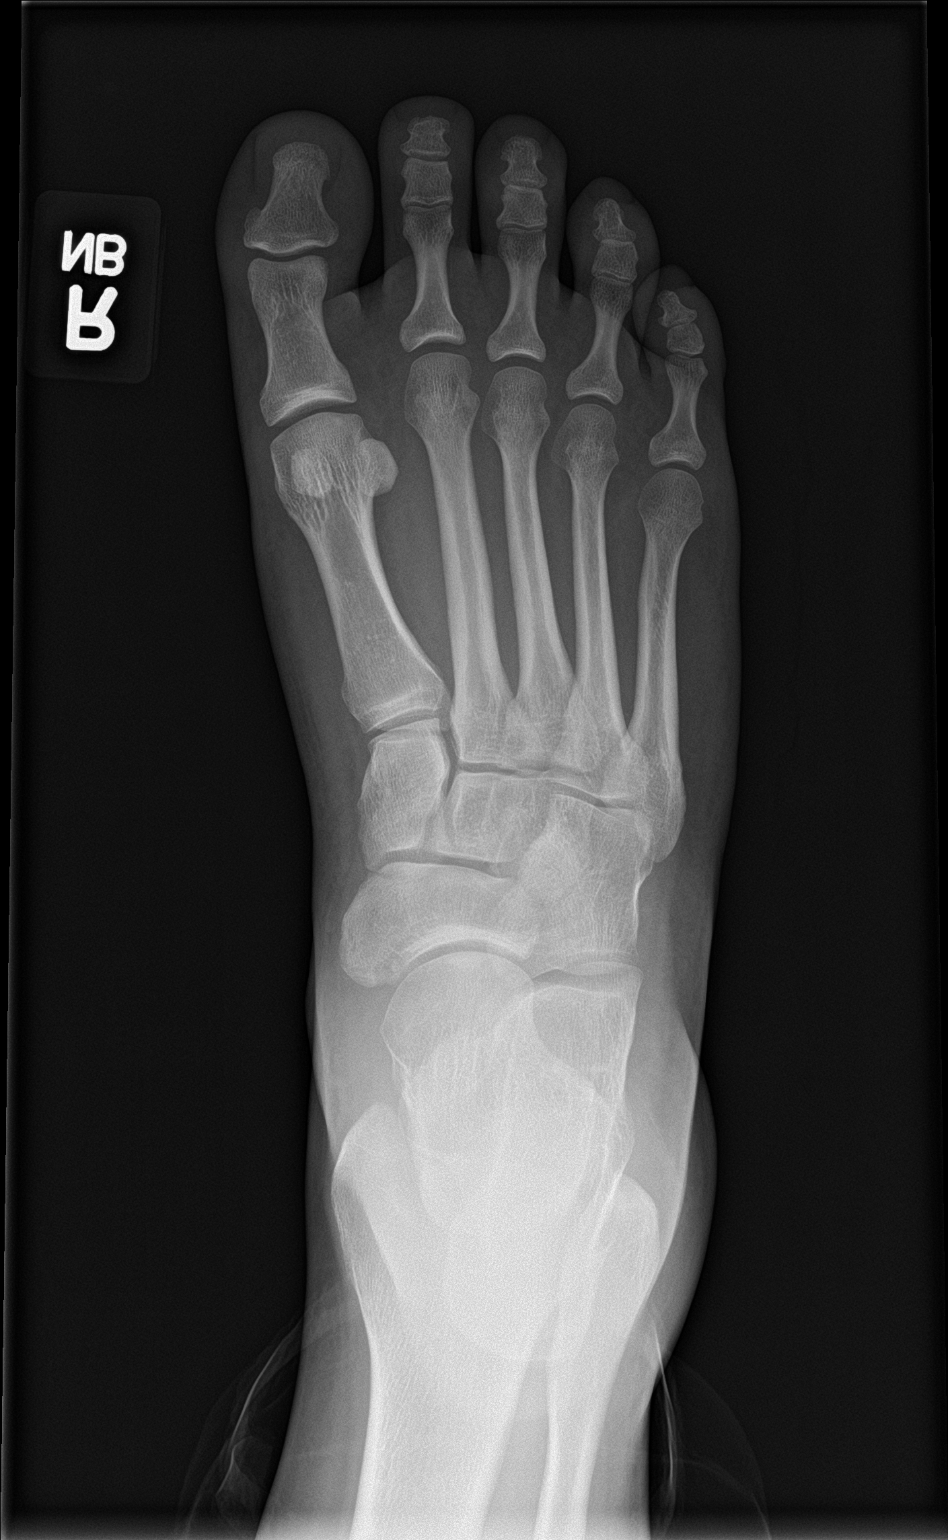

[foot obl]
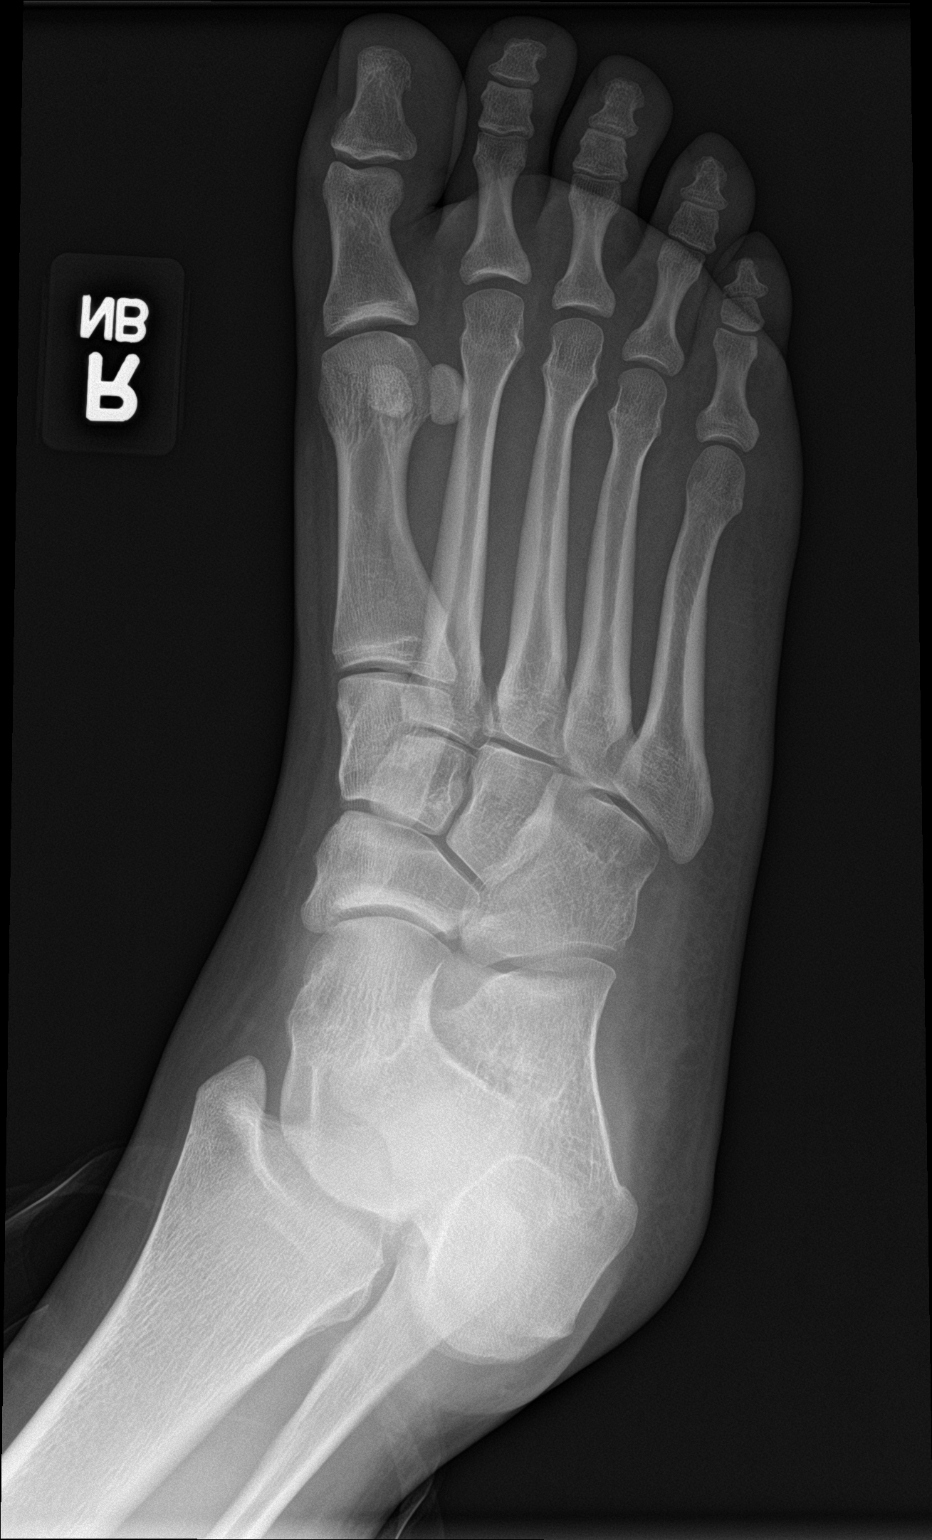

[foot lat]
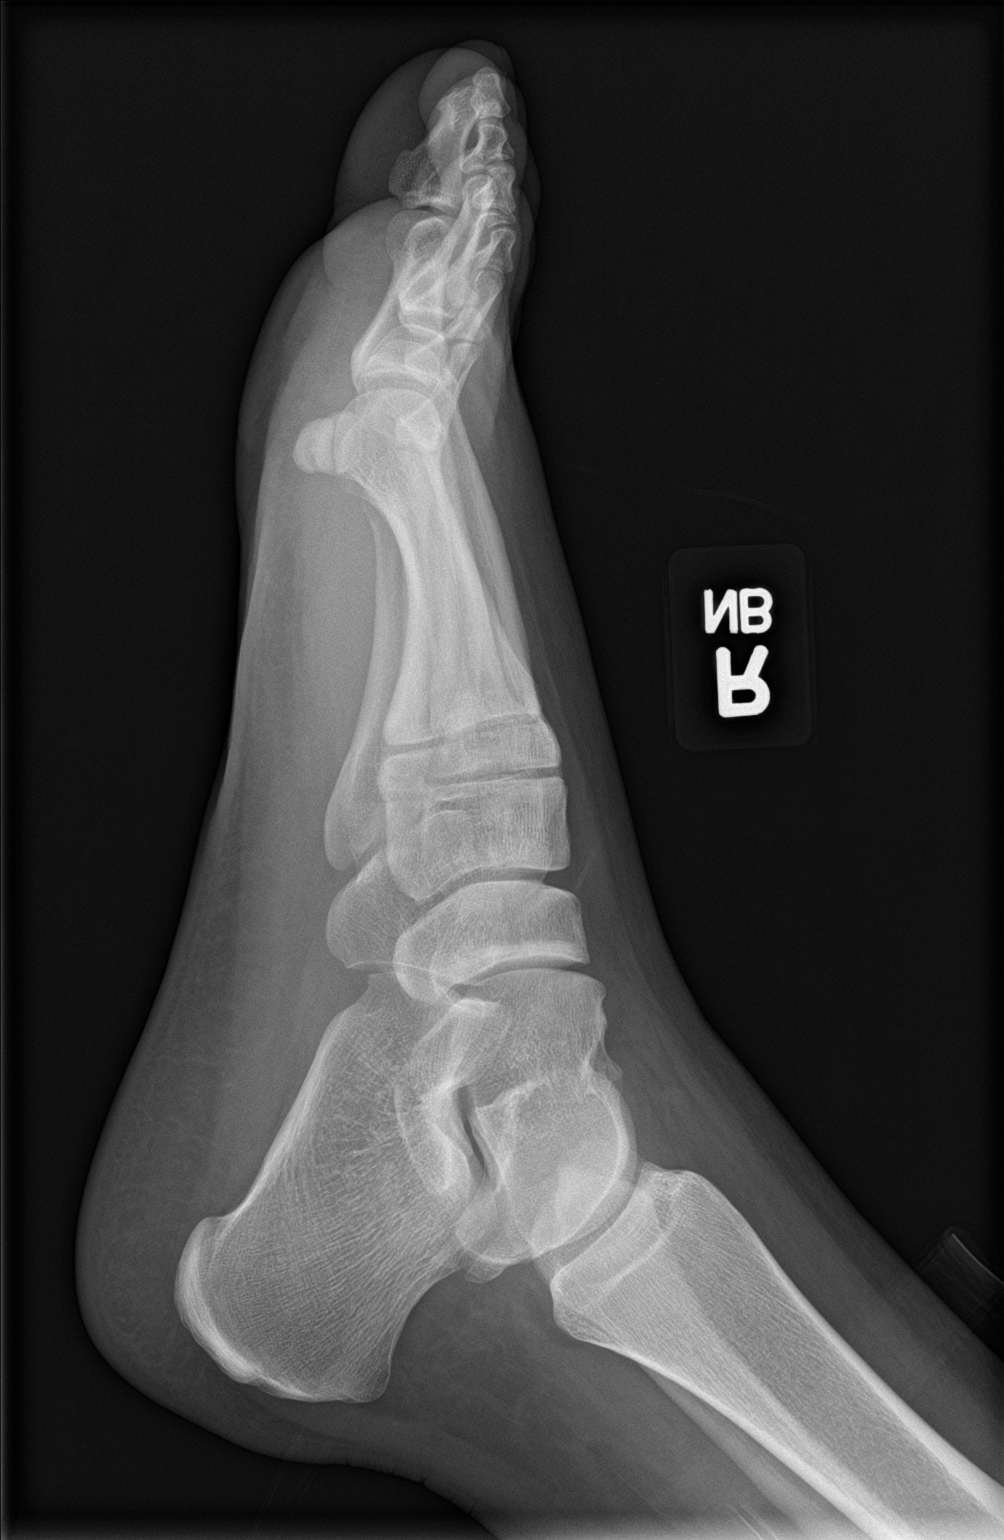

[3 of 3 positions shown; findings below may reference images not displayed]

FINDINGS: Lateral soft tissue swelling at the foot and ankle. No evidence of
fracture or dislocation.
IMPRESSION: Lateral soft tissue swelling. No fracture or dislocation.

## 2021-12-19 IMAGING — DX DG ANKLE COMPLETE 3+V*R*
3 series · 3 of 3 positions shown · non-contrast
Comparison: None.

CLINICAL DATA: Fell last night.  Pain and swelling.

EXAM:
RIGHT ANKLE - COMPLETE 3+ VIEW

[ankle ap]
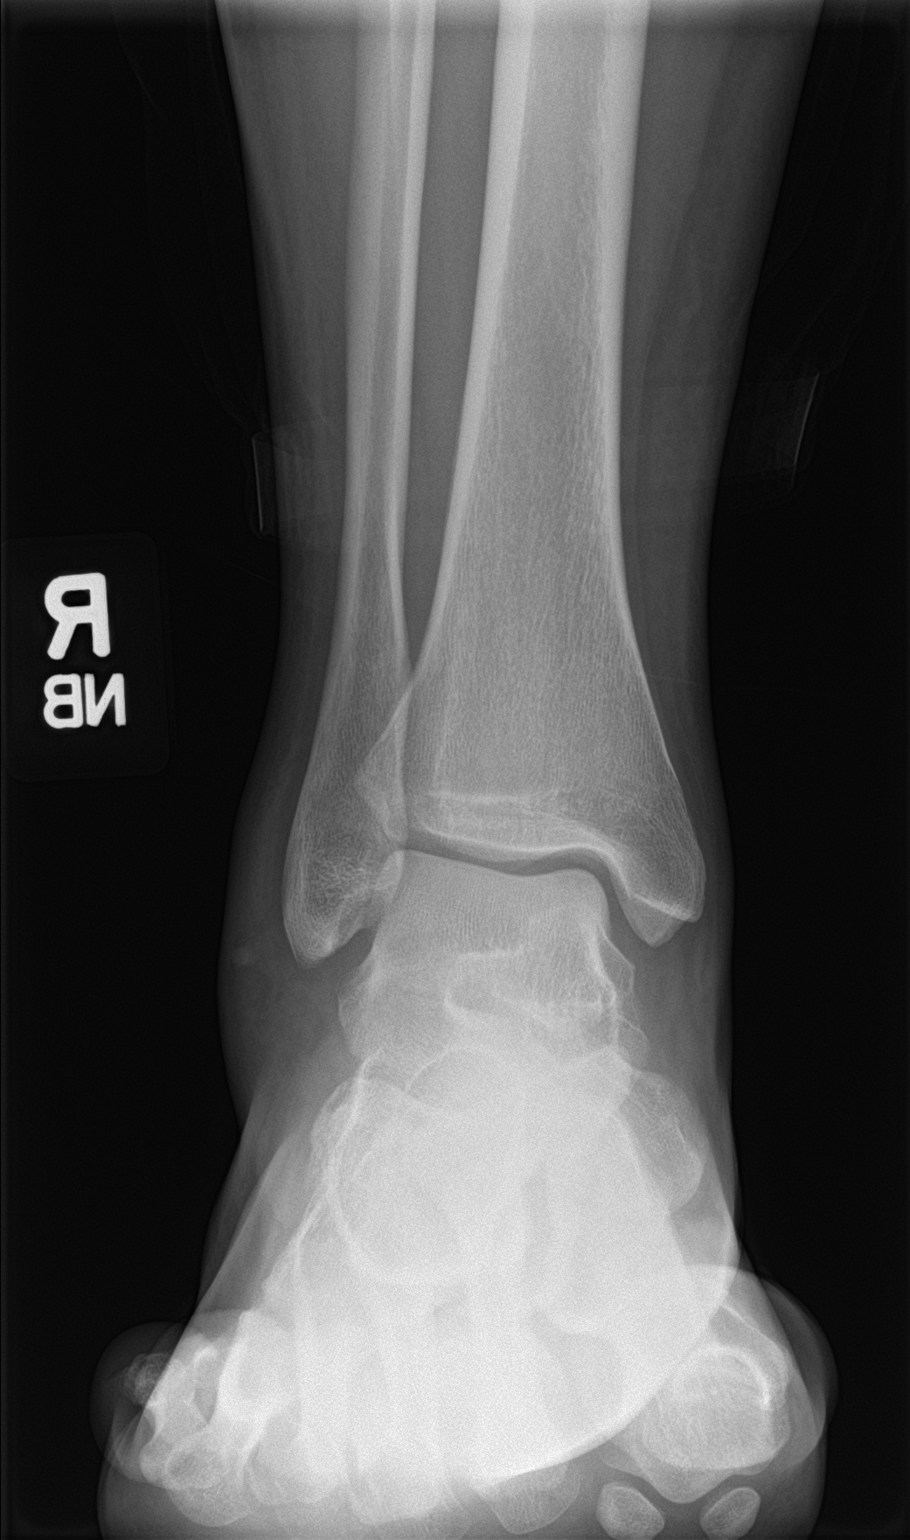

[ankle obl]
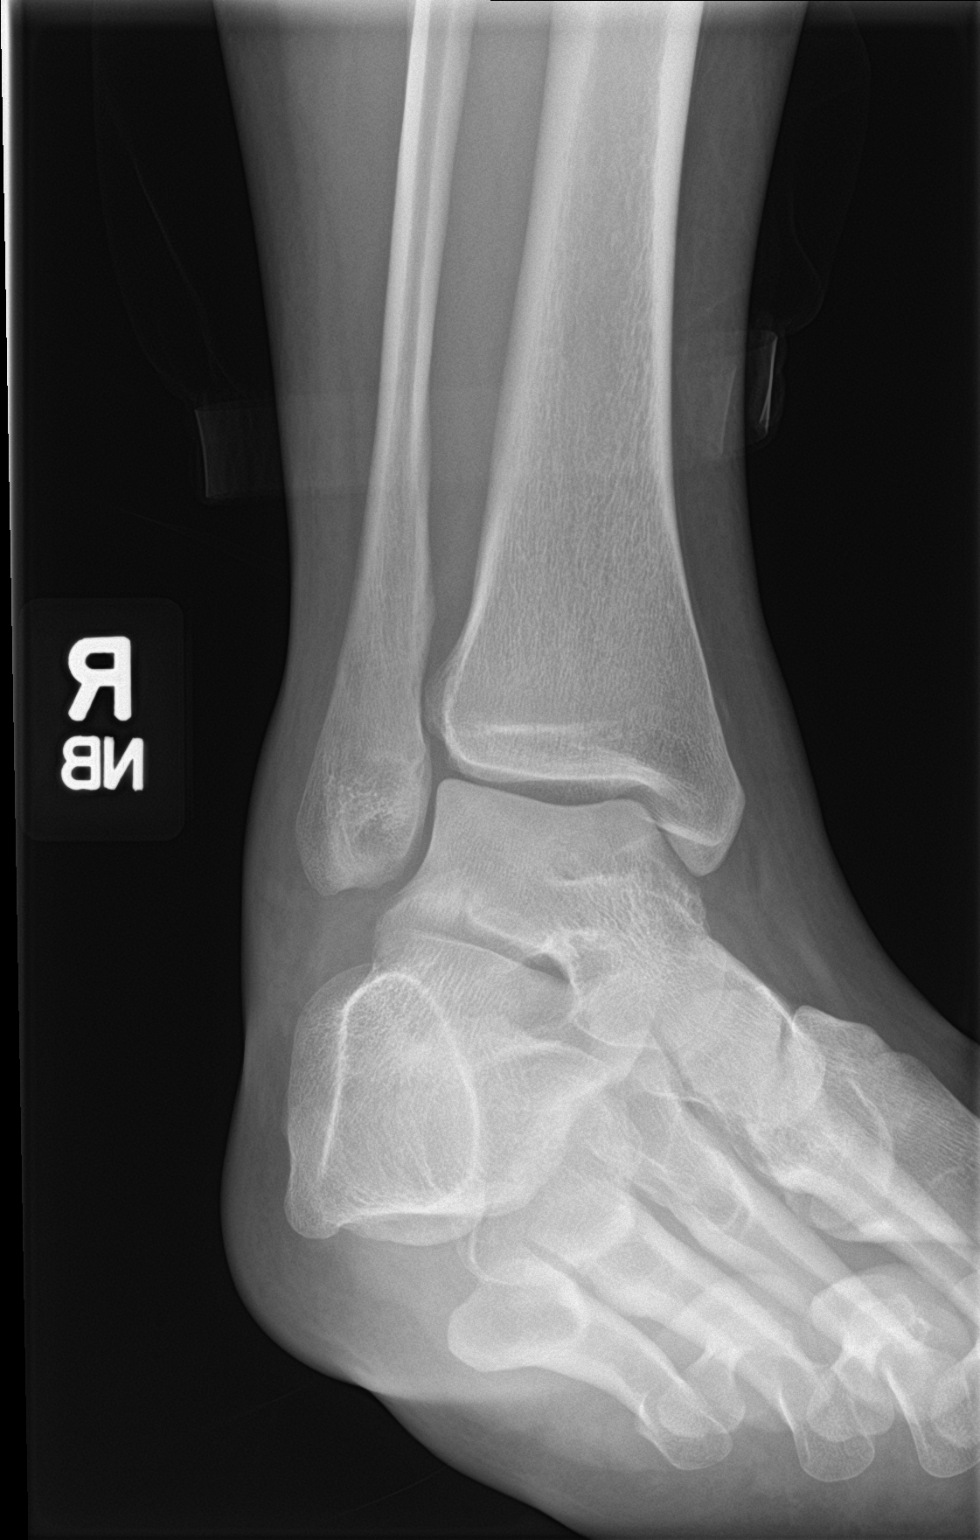

[ankle lat]
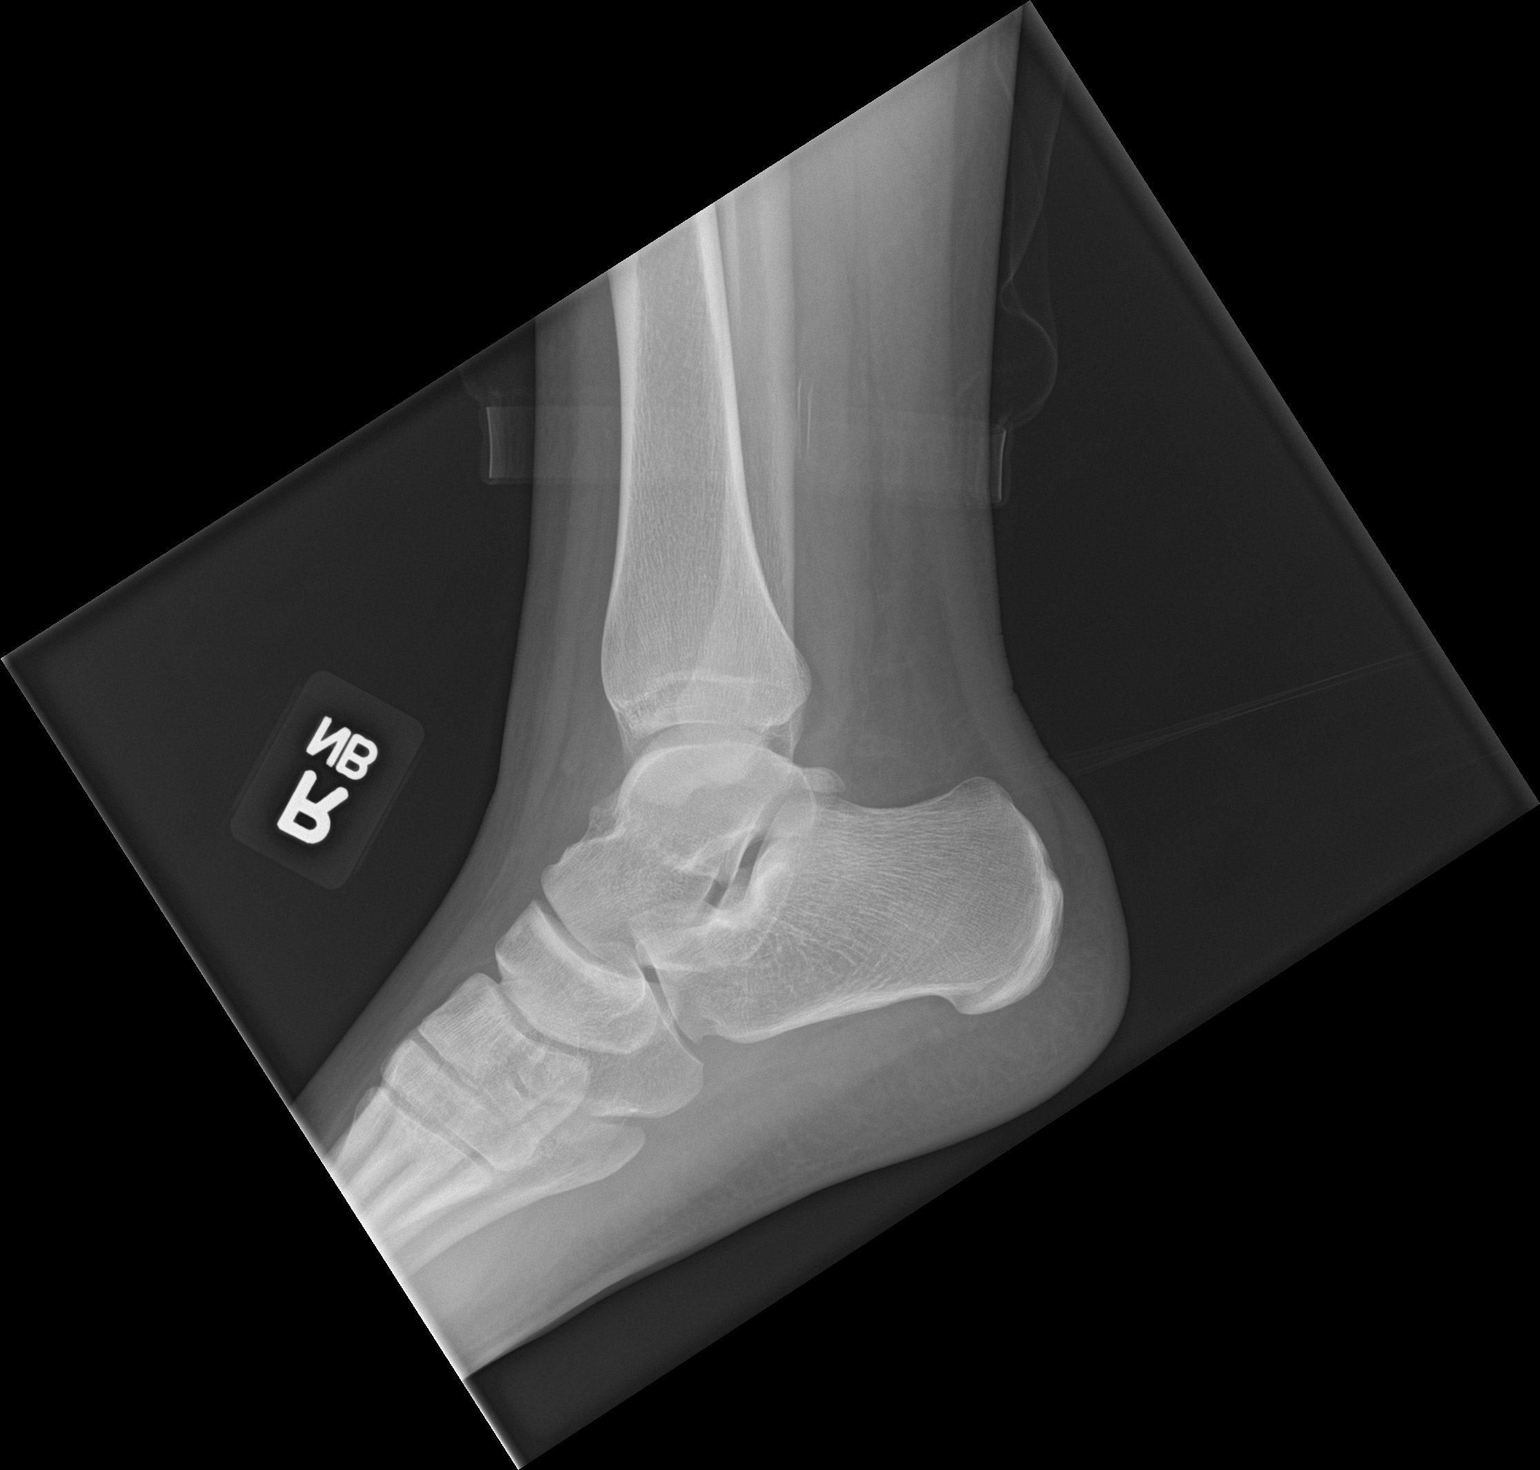

[3 of 3 positions shown; findings below may reference images not displayed]

FINDINGS: Lateral soft tissue swelling.  Evidence of fracture or dislocation.
IMPRESSION: Lateral soft tissue swelling. No fracture or dislocation.

## 2021-12-23 DIAGNOSIS — F4320 Adjustment disorder, unspecified: Secondary | ICD-10-CM | POA: Diagnosis not present

## 2021-12-28 ENCOUNTER — Encounter: Payer: Self-pay | Admitting: Family Medicine

## 2022-01-07 DIAGNOSIS — F4320 Adjustment disorder, unspecified: Secondary | ICD-10-CM | POA: Diagnosis not present

## 2022-01-25 ENCOUNTER — Encounter: Payer: Self-pay | Admitting: Family Medicine

## 2022-02-04 ENCOUNTER — Encounter: Payer: Self-pay | Admitting: Family Medicine

## 2022-02-05 MED ORDER — LISDEXAMFETAMINE DIMESYLATE 40 MG PO CAPS: 40.0000 mg | ORAL_CAPSULE | ORAL | 0 refills | Status: DC

## 2022-02-09 NOTE — Telephone Encounter (Signed)
Sounds like this needs a PA, can we check on this?  Thanks!

## 2022-02-10 DIAGNOSIS — F4320 Adjustment disorder, unspecified: Secondary | ICD-10-CM | POA: Diagnosis not present

## 2022-02-16 ENCOUNTER — Encounter: Payer: Self-pay | Admitting: Family Medicine

## 2022-02-16 ENCOUNTER — Telehealth (INDEPENDENT_AMBULATORY_CARE_PROVIDER_SITE_OTHER): Payer: Federal, State, Local not specified - PPO | Admitting: Family Medicine

## 2022-02-16 DIAGNOSIS — F900 Attention-deficit hyperactivity disorder, predominantly inattentive type: Secondary | ICD-10-CM

## 2022-02-16 NOTE — Assessment & Plan Note (Signed)
She has had difficulty obtaining increased dose of Vyvanse.  Sounds like this may need PA.  Discussed that we will work on this.  If not approved we can consider change to Concerta.

## 2022-02-16 NOTE — Progress Notes (Signed)
Kaylee Carson - 21 y.o. female MRN [id number]  Date of birth: April 16, 2001   This visit type was conducted due to national recommendations for restrictions regarding the COVID-19 Pandemic (e.g. social distancing).  This format is felt to be most appropriate for this patient at this time.  All issues noted in this document were discussed and addressed.  No physical exam was performed (except for noted visual exam findings with Video Visits).  I discussed the limitations of evaluation and management by telemedicine and the availability of in person appointments. The patient expressed understanding and agreed to proceed.  I connected withNAME@ on 02/16/22 at 10:30 AM EDT by a video enabled telemedicine application and verified that I am speaking with the correct person using two identifiers.  Present at visit: Everrett Coombe, DO Thomasene Ripple   Patient Location: Home [address] [place] [address]   Provider location:   [facility]  No chief complaint on file.   HPI  Kaylee Carson is a 21 y.o. female who presents via audio/video conferencing for a telehealth visit today.  She reports that she has had difficulty getting Vyvanse at 40mg .  She was on 30mg , however when she went to pick up the 40mg  she was told that he insurance would not cover this.  Denies any insurance changes and the 30mg  was covered without any issues.  She has been off of this for about 2 weeks.    ROS:  A comprehensive ROS was completed and negative except as noted per HPI  Past Medical History:  Diagnosis Date   GERD (gastroesophageal reflux disease)     Past Surgical History:  Procedure Laterality Date   UPPER GASTROINTESTINAL ENDOSCOPY  03/05/2019   WISDOM TOOTH EXTRACTION  01/18/2019    Family History  Problem Relation Age of Onset   Healthy Mother    Hypertension Father    Colon cancer Neg Hx    Stomach cancer Neg Hx    Rectal cancer Neg Hx    Esophageal cancer Neg Hx     Social History    Socioeconomic History   Marital status: Single    Spouse name: Not on file   Number of children: Not on file   Years of education: Not on file   Highest education level: Not on file  Occupational History   Occupation: [employer]  Tobacco Use   Smoking status: Never   Smokeless tobacco: Never  Vaping Use   Vaping Use: Former  Substance and Sexual Activity   Alcohol use: Not Currently   Drug use: Never   Sexual activity: Not Currently    Birth control/protection: Abstinence  Other Topics Concern   Not on file  Social History Narrative   Not on file   Social Determinants of Health   Financial Resource Strain: Not on file  Food Insecurity: Not on file  Transportation Needs: Not on file  Physical Activity: Not on file  Stress: Not on file  Social Connections: Not on file  Intimate Partner Violence: Not on file     Current Outpatient Medications:    lisdexamfetamine (VYVANSE) 40 MG capsule, Take 1 capsule (40 mg total) by mouth every morning., Disp: 30 capsule, Rfl: 0   triamcinolone cream (KENALOG) 0.1 %, Apply 1 application topically 2 (two) times daily. Apply as needed to hands, Disp: 30 g, Rfl: 0  EXAM:  VITALS per patient if applicable: There were no vitals taken for this visit.  GENERAL: alert, oriented, appears well  and in no acute distress  HEENT: atraumatic, conjunttiva clear, no obvious abnormalities on inspection of external nose and ears  NECK: normal movements of the head and neck  LUNGS: on inspection no signs of respiratory distress, breathing rate appears normal, no obvious gross SOB, gasping or wheezing  CV: no obvious cyanosis  MS: moves all visible extremities without noticeable abnormality  PSYCH/NEURO: pleasant and cooperative, no obvious depression or anxiety, speech and thought processing grossly intact  ASSESSMENT AND PLAN:  Discussed the following assessment and plan:  ADHD She has had difficulty obtaining increased dose of  Vyvanse.  Sounds like this may need PA.  Discussed that we will work on this.  If not approved we can consider change to Concerta.      I discussed the assessment and treatment plan with the patient. The patient was provided an opportunity to ask questions and all were answered. The patient agreed with the plan and demonstrated an understanding of the instructions.   The patient was advised to call back or seek an in-person evaluation if the symptoms worsen or if the condition fails to improve as anticipated.    Everrett Coombe, DO

## 2022-02-22 ENCOUNTER — Encounter: Payer: Self-pay | Admitting: Family Medicine

## 2022-02-24 DIAGNOSIS — F4320 Adjustment disorder, unspecified: Secondary | ICD-10-CM | POA: Diagnosis not present

## 2022-03-01 ENCOUNTER — Telehealth (INDEPENDENT_AMBULATORY_CARE_PROVIDER_SITE_OTHER): Payer: Federal, State, Local not specified - PPO | Admitting: Family Medicine

## 2022-03-01 ENCOUNTER — Encounter: Payer: Self-pay | Admitting: Family Medicine

## 2022-03-01 DIAGNOSIS — F5101 Primary insomnia: Secondary | ICD-10-CM

## 2022-03-01 DIAGNOSIS — G4700 Insomnia, unspecified: Secondary | ICD-10-CM | POA: Insufficient documentation

## 2022-03-01 MED ORDER — TRAZODONE HCL 50 MG PO TABS: 50.0000 mg | ORAL_TABLET | Freq: Every evening | ORAL | 2 refills | Status: DC | PRN

## 2022-03-01 NOTE — Progress Notes (Signed)
Difficulty sleeping x 2 months a few nights a week. Taking Melatonin to fall asleep. Waking and unable to go back to sleep.

## 2022-03-01 NOTE — Assessment & Plan Note (Signed)
Trazodone has worked well for her in the past.  We will add this back on the 50 to 100 mg nightly as needed.  Follow-up in 4 to 6 weeks.

## 2022-03-01 NOTE — Progress Notes (Signed)
Kaylee Carson - 21 y.o. female MRN [id number]  Date of birth: [birth year]   This visit type was conducted due to national recommendations for restrictions regarding the COVID-19 Pandemic (e.g. social distancing).  This format is felt to be most appropriate for this patient at this time.  All issues noted in this document were discussed and addressed.  No physical exam was performed (except for noted visual exam findings with Video Visits).  I discussed the limitations of evaluation and management by telemedicine and the availability of in person appointments. The patient expressed understanding and agreed to proceed.  I connected withNAME@ on 03/01/22 at 11:30 AM EDT by a video enabled telemedicine application and verified that I am speaking with the correct person using two identifiers.  Present at visit: Everrett Coombe, DO Thomasene Ripple   Patient Location: Home [address] [place] [address]   Provider location:   [facility]  Chief Complaint  Patient presents with   Insomnia    HPI  Kaylee Carson is a 21 y.o. female who presents via audio/video conferencing for a telehealth visit today.  She reports she is having difficulty with sleep.  She has tried melatonin which does help her fall asleep initially about still has difficulty staying asleep.  She has had trouble with sleep throughout most of her life.  She was on trazodone which she was a child.  This worked pretty well for her.  She does follow good sleep hygiene.  She does take her Vyvanse first thing in the morning.   ROS:  A comprehensive ROS was completed and negative except as noted per HPI  Past Medical History:  Diagnosis Date   GERD (gastroesophageal reflux disease)     Past Surgical History:  Procedure Laterality Date   UPPER GASTROINTESTINAL ENDOSCOPY  03/05/2019   WISDOM TOOTH EXTRACTION  01/18/2019    Family History  Problem Relation Age of Onset   Healthy Mother    Hypertension Father    Colon  cancer Neg Hx    Stomach cancer Neg Hx    Rectal cancer Neg Hx    Esophageal cancer Neg Hx     Social History   Socioeconomic History   Marital status: Single    Spouse name: Not on file   Number of children: Not on file   Years of education: Not on file   Highest education level: Not on file  Occupational History   Occupation: [employer]  Tobacco Use   Smoking status: Never   Smokeless tobacco: Never  Vaping Use   Vaping Use: Former  Substance and Sexual Activity   Alcohol use: Not Currently   Drug use: Never   Sexual activity: Not Currently    Birth control/protection: Abstinence  Other Topics Concern   Not on file  Social History Narrative   Not on file   Social Determinants of Health   Financial Resource Strain: Not on file  Food Insecurity: Not on file  Transportation Needs: Not on file  Physical Activity: Not on file  Stress: Not on file  Social Connections: Not on file  Intimate Partner Violence: Not on file     Current Outpatient Medications:    lisdexamfetamine (VYVANSE) 40 MG capsule, Take 1 capsule (40 mg total) by mouth every morning., Disp: 30 capsule, Rfl: 0   traZODone (DESYREL) 50 MG tablet, Take 1-2 tablets (50-100 mg total) by mouth at bedtime as needed for sleep., Disp: 90 tablet, Rfl: 2   triamcinolone  cream (KENALOG) 0.1 %, Apply 1 application topically 2 (two) times daily. Apply as needed to hands, Disp: 30 g, Rfl: 0  EXAM:  VITALS per patient if applicable: Temp 98.6 F (37 C)   Ht 5\' 9"  (1.753 m)   Wt 175 lb (79.4 kg)   BMI 25.84 kg/m   GENERAL: alert, oriented, appears well and in no acute distress  HEENT: atraumatic, conjunttiva clear, no obvious abnormalities on inspection of external nose and ears  NECK: normal movements of the head and neck  LUNGS: on inspection no signs of respiratory distress, breathing rate appears normal, no obvious gross SOB, gasping or wheezing  CV: no obvious cyanosis  MS: moves all  visible extremities without noticeable abnormality  PSYCH/NEURO: pleasant and cooperative, no obvious depression or anxiety, speech and thought processing grossly intact  ASSESSMENT AND PLAN:  Discussed the following assessment and plan:  Insomnia Trazodone has worked well for her in the past.  We will add this back on the 50 to 100 mg nightly as needed.  Follow-up in 4 to 6 weeks.     I discussed the assessment and treatment plan with the patient. The patient was provided an opportunity to ask questions and all were answered. The patient agreed with the plan and demonstrated an understanding of the instructions. The patient was advised to call back or seek an in-person evaluation if the symptoms worsen or if the condition fails to improve as anticipated.    , DO

## 2022-03-08 ENCOUNTER — Telehealth: Payer: Self-pay

## 2022-03-08 NOTE — Telephone Encounter (Signed)
Initiated Prior authorization [id number] 40MG  capsules Via: Covermymeds Case/Key:[id number] Status: approved  as of  03/08/22 Reason:The authorization is valid from 01/17/2022 to 02/16/2023. Notified Pt via: called

## 2022-03-12 NOTE — Telephone Encounter (Signed)
error 

## 2022-03-17 DIAGNOSIS — F4320 Adjustment disorder, unspecified: Secondary | ICD-10-CM | POA: Diagnosis not present

## 2022-03-31 DIAGNOSIS — F4320 Adjustment disorder, unspecified: Secondary | ICD-10-CM | POA: Diagnosis not present

## 2022-04-08 ENCOUNTER — Encounter: Payer: Self-pay | Admitting: Family Medicine

## 2022-04-28 DIAGNOSIS — F4320 Adjustment disorder, unspecified: Secondary | ICD-10-CM | POA: Diagnosis not present

## 2022-06-01 DIAGNOSIS — F4320 Adjustment disorder, unspecified: Secondary | ICD-10-CM | POA: Diagnosis not present

## 2022-06-28 ENCOUNTER — Other Ambulatory Visit: Payer: Self-pay | Admitting: Family Medicine

## 2022-06-28 MED ORDER — LISDEXAMFETAMINE DIMESYLATE 40 MG PO CAPS: 40.0000 mg | ORAL_CAPSULE | ORAL | 0 refills | Status: DC

## 2022-10-25 ENCOUNTER — Ambulatory Visit (INDEPENDENT_AMBULATORY_CARE_PROVIDER_SITE_OTHER): Payer: Federal, State, Local not specified - PPO | Admitting: Family Medicine

## 2022-10-25 ENCOUNTER — Encounter: Payer: Self-pay | Admitting: Family Medicine

## 2022-10-25 VITALS — BP 99/68 | HR 92 | Ht 69.0 in | Wt 183.0 lb

## 2022-10-25 DIAGNOSIS — Z124 Encounter for screening for malignant neoplasm of cervix: Secondary | ICD-10-CM | POA: Diagnosis not present
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
## 2022-10-25 NOTE — Assessment & Plan Note (Signed)
Bruising seems to be resolving.  Mild pain to palpation.  No red flag symptoms on her abdominal exam.  She does have some mild dizziness which may be related to mild concussion.  Her neuroexam is nonfocal.  We discussed that if she is having new or worsening symptoms I would recommend that she return to the clinic.

## 2022-10-28 ENCOUNTER — Emergency Department (HOSPITAL_BASED_OUTPATIENT_CLINIC_OR_DEPARTMENT_OTHER)
Admission: EM | Admit: 2022-10-28 | Discharge: 2022-10-28 | Disposition: A | Discharge status: Home / Self Care | Payer: Federal, State, Local not specified - PPO | Attending: Emergency Medicine | Admitting: Emergency Medicine

## 2022-10-28 ENCOUNTER — Encounter (HOSPITAL_BASED_OUTPATIENT_CLINIC_OR_DEPARTMENT_OTHER): Payer: Self-pay | Admitting: Emergency Medicine

## 2022-10-28 ENCOUNTER — Emergency Department (HOSPITAL_BASED_OUTPATIENT_CLINIC_OR_DEPARTMENT_OTHER): Payer: Federal, State, Local not specified - PPO

## 2022-10-28 ENCOUNTER — Other Ambulatory Visit: Payer: Self-pay

## 2022-10-28 DIAGNOSIS — S060X0A Concussion without loss of consciousness, initial encounter: Secondary | ICD-10-CM | POA: Diagnosis not present

## 2022-10-28 DIAGNOSIS — R519 Headache, unspecified: Secondary | ICD-10-CM | POA: Diagnosis not present

## 2022-10-28 DIAGNOSIS — S0990XA Unspecified injury of head, initial encounter: Secondary | ICD-10-CM | POA: Diagnosis not present

## 2022-10-28 MED ORDER — ONDANSETRON 4 MG PO TBDP: 4.0000 mg | ORAL_TABLET | Freq: Three times a day (TID) | ORAL | 0 refills | Status: DC | PRN
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
## 2022-10-28 NOTE — Discharge Instructions (Signed)
You have been seen today for your complaint of concussion. Your imaging was reassuring and showed no abnormalities. Your discharge medications include Alternate tylenol and ibuprofen for pain. You may alternate these every 4 hours. You may take up to 800 mg of ibuprofen at a time and up to 1000 mg of tylenol. Home care instructions are as follows:  Avoid screens as much as possible.  Sleep is much as he typically do.  Set an alarm she did not sleep too much Follow up with: Your primary care provider in 1 week for reevaluation Please seek immediate medical care if you develop any of the following symptoms: You have a severe or worsening headache. You have weakness or numbness in any part of your body, slurred speech, vision changes, or confusion. You vomit repeatedly. You lose consciousness, are sleepier than normal, or are difficult to wake up. You have a seizure. At this time there does not appear to be the presence of an emergent medical condition, however there is always the potential for conditions to change. Please read and follow the below instructions.  Do not take your medicine if  develop an itchy rash, swelling in your mouth or lips, or difficulty breathing; call 911 and seek immediate emergency medical attention if this occurs.  You may review your lab tests and imaging results in their entirety on your MyChart account.  Please discuss all results of fully with your primary care provider and other specialist at your follow-up visit.  Note: Portions of this text may have been transcribed using voice recognition software. Every effort was made to ensure accuracy; however, inadvertent computerized transcription errors may still be present.
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
## 2022-10-28 NOTE — ED Notes (Signed)

## 2022-11-01 DIAGNOSIS — Z6826 Body mass index (BMI) 26.0-26.9, adult: Secondary | ICD-10-CM | POA: Diagnosis not present

## 2022-11-01 DIAGNOSIS — J029 Acute pharyngitis, unspecified: Secondary | ICD-10-CM | POA: Diagnosis not present

## 2022-11-05 DIAGNOSIS — J039 Acute tonsillitis, unspecified: Secondary | ICD-10-CM | POA: Diagnosis not present

## 2022-11-30 DIAGNOSIS — F901 Attention-deficit hyperactivity disorder, predominantly hyperactive type: Secondary | ICD-10-CM | POA: Diagnosis not present

## 2022-12-28 DIAGNOSIS — F901 Attention-deficit hyperactivity disorder, predominantly hyperactive type: Secondary | ICD-10-CM | POA: Diagnosis not present

## 2023-02-02 DIAGNOSIS — F901 Attention-deficit hyperactivity disorder, predominantly hyperactive type: Secondary | ICD-10-CM | POA: Diagnosis not present

## 2023-03-02 DIAGNOSIS — F901 Attention-deficit hyperactivity disorder, predominantly hyperactive type: Secondary | ICD-10-CM | POA: Diagnosis not present

## 2023-03-16 DIAGNOSIS — F901 Attention-deficit hyperactivity disorder, predominantly hyperactive type: Secondary | ICD-10-CM | POA: Diagnosis not present

## 2023-03-17 DIAGNOSIS — Z6828 Body mass index (BMI) 28.0-28.9, adult: Secondary | ICD-10-CM | POA: Diagnosis not present

## 2023-03-17 DIAGNOSIS — B070 Plantar wart: Secondary | ICD-10-CM | POA: Diagnosis not present

## 2023-03-25 DIAGNOSIS — M79644 Pain in right finger(s): Secondary | ICD-10-CM | POA: Diagnosis not present

## 2023-03-25 DIAGNOSIS — S60111A Contusion of right thumb with damage to nail, initial encounter: Secondary | ICD-10-CM | POA: Diagnosis not present

## 2023-03-25 DIAGNOSIS — B9689 Other specified bacterial agents as the cause of diseases classified elsewhere: Secondary | ICD-10-CM | POA: Diagnosis not present

## 2023-03-25 DIAGNOSIS — L089 Local infection of the skin and subcutaneous tissue, unspecified: Secondary | ICD-10-CM | POA: Diagnosis not present

## 2023-03-26 ENCOUNTER — Other Ambulatory Visit (HOSPITAL_BASED_OUTPATIENT_CLINIC_OR_DEPARTMENT_OTHER): Payer: Self-pay

## 2023-03-26 MED ORDER — CEPHALEXIN 500 MG PO CAPS
500.0000 mg | ORAL_CAPSULE | Freq: Three times a day (TID) | ORAL | 0 refills | Status: DC
  Filled 2023-03-26: qty 30, 10d supply, fill #0

## 2023-03-28 ENCOUNTER — Other Ambulatory Visit (HOSPITAL_BASED_OUTPATIENT_CLINIC_OR_DEPARTMENT_OTHER): Payer: Self-pay

## 2023-04-04 DIAGNOSIS — B079 Viral wart, unspecified: Secondary | ICD-10-CM | POA: Diagnosis not present

## 2023-04-08 ENCOUNTER — Other Ambulatory Visit (HOSPITAL_BASED_OUTPATIENT_CLINIC_OR_DEPARTMENT_OTHER): Payer: Self-pay

## 2023-04-13 DIAGNOSIS — F901 Attention-deficit hyperactivity disorder, predominantly hyperactive type: Secondary | ICD-10-CM | POA: Diagnosis not present

## 2023-05-10 DIAGNOSIS — F901 Attention-deficit hyperactivity disorder, predominantly hyperactive type: Secondary | ICD-10-CM | POA: Diagnosis not present

## 2023-07-12 ENCOUNTER — Ambulatory Visit: Payer: Federal, State, Local not specified - PPO | Admitting: Medical-Surgical

## 2023-07-12 ENCOUNTER — Encounter: Payer: Self-pay | Admitting: Medical-Surgical

## 2023-07-12 VITALS — BP 111/63 | HR 69 | Resp 20 | Ht 69.0 in | Wt 193.5 lb

## 2023-07-12 DIAGNOSIS — Z7689 Persons encountering health services in other specified circumstances: Secondary | ICD-10-CM | POA: Diagnosis not present

## 2023-07-12 DIAGNOSIS — F419 Anxiety disorder, unspecified: Secondary | ICD-10-CM

## 2023-07-12 DIAGNOSIS — F900 Attention-deficit hyperactivity disorder, predominantly inattentive type: Secondary | ICD-10-CM | POA: Diagnosis not present

## 2023-07-12 MED ORDER — BUSPIRONE HCL 5 MG PO TABS: 5.0000 mg | ORAL_TABLET | Freq: Three times a day (TID) | ORAL | 1 refills | Status: DC | PRN

## 2023-07-12 NOTE — Progress Notes (Signed)
Established Patient Office Visit  Subjective   Patient ID: Kaylee Carson, female   DOB: 04-26-2001 Age: 23 y.o. MRN: [id number]   Chief Complaint  Patient presents with   Transitions Of Care    HPI Very pleasant 23 year old female presenting today to transfer care to a new PCP.    ADHD: was previously under the care of psychiatry who tried her on several medications including Vyvanse, Adderall, and Clonidine for management of her ADHD symptoms. She had significant side effects and poor efficacy for each one so decided to discontinue. Not currently medicated but reports doing well in college and not having trouble focusing while at work or school. Wonders if her symptoms may be more related to anxiety/depression or even possible OCD.   Mood: As noted above, was seeing psychiatry. She was tried on Zoloft and Fluoxetine but these were not tolerated. Not currently medicated. Feels that her anxiety is worse and she often has irritability and anger when she feels that she is not in control. Tends to hyperfixate on small things and if changes happen to the plan, it's difficult for her to adjust. Open to option to manage her anxiety and associated symptoms.   Objective:    Vitals:   07/12/23 1120  BP: 111/63  Pulse: 69  Resp: 20  Height: 5\' 9"  (1.753 m)  Weight: 193 lb 8 oz (87.8 kg)  SpO2: 99%  BMI (Calculated): 28.56    Physical Exam Vitals reviewed.  Constitutional:      General: She is not in acute distress.    Appearance: Normal appearance. She is not ill-appearing.  HENT:     Head: Normocephalic and atraumatic.  Cardiovascular:     Rate and Rhythm: Normal rate and regular rhythm.     Pulses: Normal pulses.     Heart sounds: Normal heart sounds.  Pulmonary:     Effort: Pulmonary effort is normal. No respiratory distress.     Breath sounds: Normal breath sounds. No wheezing, rhonchi or rales.  Skin:    General: Skin is warm and dry.  Neurological:     Mental Status: She  is alert and oriented to person, place, and time.  Psychiatric:        Mood and Affect: Mood normal.        Behavior: Behavior normal.        Thought Content: Thought content normal.        Judgment: Judgment normal.   No results found for this or any previous visit (from the past 24 hours).     The ASCVD Risk score (Arnett DK, et al., 2019) failed to calculate for the following reasons:   The 2019 ASCVD risk score is only valid for ages 57 to 39   Assessment & Plan:   1. Encounter to establish care (Primary) Reviewed available information and discussed care concerns with patient.   2. Attention deficit hyperactivity disorder (ADHD), predominantly inattentive type Deferring medication at this time. Doing well in school and at work. Plan to manage anxiety before reevaluating the need for medication management of attention issues.   3. Anxiety Discussed various options including Lexapro, Celexa, Effexor, and Pristiq. She is understandably hesitant due to her past issues with SSRIs. Discussed using BuSpar for anxiety. She would like to try this so sending BuSpar 5-15mg  TID prn. Advised her to start with once nightly and if well tolerated, increase to twice daily. If that is well tolerated and helpful, ok to add an afternoon dose  in if needed. Plan for close follow up in around 4 weeks to evaluate tolerance. Advised her to reach out with questions or concerns via MyChart if needed.    Return for annual physical exam at your convenience.  ___________________________________________ Thayer Ohm, DNP, APRN, FNP-BC Primary Care and Sports [facility]

## 2023-08-25 ENCOUNTER — Ambulatory Visit: Payer: Federal, State, Local not specified - PPO | Admitting: Medical-Surgical

## 2023-08-25 ENCOUNTER — Encounter: Payer: Self-pay | Admitting: Medical-Surgical

## 2023-08-25 VITALS — BP 109/71 | HR 59 | Resp 20 | Ht 69.0 in | Wt 181.7 lb

## 2023-08-25 DIAGNOSIS — Z0000 Encounter for general adult medical examination without abnormal findings: Secondary | ICD-10-CM

## 2023-08-25 DIAGNOSIS — F419 Anxiety disorder, unspecified: Secondary | ICD-10-CM

## 2023-08-25 DIAGNOSIS — Z124 Encounter for screening for malignant neoplasm of cervix: Secondary | ICD-10-CM | POA: Diagnosis not present

## 2023-08-25 NOTE — Progress Notes (Signed)
 Complete physical exam  Patient: Kaylee Carson   DOB: 01/05/2001   22 y.o. Female  MRN: [id number]  Subjective:    Chief Complaint  Patient presents with   Annual Exam    Kaylee Carson is a 23 y.o. female who presents today for a complete physical exam. She reports consuming a general diet.  Workouts 5-6 days per week at the gym.   She generally feels well. She reports sleeping fairly well. She does not have additional problems to discuss today.    Most recent fall risk assessment:    10/25/2022   11:18 AM  Fall Risk   Falls in the past year? 1  Number falls in past yr: 0  Injury with Fall? 1  Risk for fall due to : Other (Comment)  Follow up Falls evaluation completed     Most recent depression screenings:    07/12/2023   11:21 AM 11/24/2021    3:01 PM  PHQ 2/9 Scores  PHQ - 2 Score 0 0  PHQ- 9 Score  2    Vision:Within last year, Dental: No current dental problems and Receives regular dental care, and STD: The patient denies history of sexually transmitted disease.    Patient Care Team: Kaylee Butter, NP as PCP - General (Nurse Practitioner)   Outpatient Medications Prior to Visit  Medication Sig   [DISCONTINUED] busPIRone (BUSPAR) 5 MG tablet Take 1-3 tablets (5-15 mg total) by mouth 3 (three) times daily as needed. (Patient not taking: Reported on 08/25/2023)   No facility-administered medications prior to visit.   Review of Systems  Constitutional:  Negative for chills, fever, malaise/fatigue and weight loss.  HENT:  Negative for congestion, ear pain, hearing loss, sinus pain and sore throat.   Eyes:  Negative for blurred vision, photophobia and pain.  Respiratory:  Negative for cough, shortness of breath and wheezing.   Cardiovascular:  Negative for chest pain, palpitations and leg swelling.  Gastrointestinal:  Negative for abdominal pain, constipation, diarrhea, heartburn, nausea and vomiting.  Genitourinary:  Negative for dysuria, frequency and urgency.   Musculoskeletal:  Negative for falls and neck pain.  Skin:  Negative for itching and rash.  Neurological:  Negative for dizziness, weakness and headaches.  Endo/Heme/Allergies:  Negative for polydipsia. Does not bruise/bleed easily.  Psychiatric/Behavioral:  Positive for depression, substance abuse and suicidal ideas. The patient is nervous/anxious.      Objective:    BP 109/71 (BP Location: Right Arm, Cuff Size: Normal)   Pulse (!) 59   Resp 20   Ht 5\' 9"  (1.753 m)   Wt 181 lb 11.2 oz (82.4 kg)   SpO2 100%   BMI 26.83 kg/m    Physical Exam Vitals reviewed. Exam conducted with a chaperone present.  Constitutional:      General: She is not in acute distress.    Appearance: Normal appearance. She is not ill-appearing.  HENT:     Head: Normocephalic and atraumatic.     Right Ear: Tympanic membrane, ear canal and external ear normal. There is no impacted cerumen.     Left Ear: Tympanic membrane, ear canal and external ear normal. There is no impacted cerumen.     Nose: Nose normal. No congestion or rhinorrhea.     Mouth/Throat:     Mouth: Mucous membranes are moist.     Pharynx: No oropharyngeal exudate or posterior oropharyngeal erythema.  Eyes:     General: No scleral icterus.       Right  eye: No discharge.        Left eye: No discharge.     Extraocular Movements: Extraocular movements intact.     Conjunctiva/sclera: Conjunctivae normal.     Pupils: Pupils are equal, round, and reactive to light.  Neck:     Thyroid: No thyromegaly.     Vascular: No carotid bruit or JVD.     Trachea: Trachea normal.  Cardiovascular:     Rate and Rhythm: Normal rate and regular rhythm.     Pulses: Normal pulses.     Heart sounds: Normal heart sounds. No murmur heard.    No friction rub. No gallop.  Pulmonary:     Effort: Pulmonary effort is normal. No respiratory distress.     Breath sounds: Normal breath sounds. No wheezing.  Abdominal:     General: Bowel sounds are normal. There is  no distension.     Palpations: Abdomen is soft.     Tenderness: There is no abdominal tenderness. There is no guarding.  Genitourinary:    General: Normal vulva.     Exam position: Lithotomy position.     Labia:        Right: No rash, tenderness, lesion or injury.        Left: No rash, tenderness, lesion or injury.   Musculoskeletal:        General: Normal range of motion.     Cervical back: Normal range of motion and neck supple.  Lymphadenopathy:     Cervical: No cervical adenopathy.  Skin:    General: Skin is warm and dry.  Neurological:     Mental Status: She is alert and oriented to person, place, and time.     Cranial Nerves: No cranial nerve deficit.  Psychiatric:        Mood and Affect: Mood normal.        Behavior: Behavior normal.        Thought Content: Thought content normal.        Judgment: Judgment normal.      No results found for any visits on 08/25/23.     Assessment & Plan:    Routine Health Maintenance and Physical Exam  Immunization History  Administered Date(s) Administered   DTaP 03/07/2000, 05/05/2000, 07/06/2000, 04/05/2001, 10/15/2004   HIB (PRP-OMP) 03/07/2000, 05/05/2000, 07/06/2000, 01/04/2001   HIB, Unspecified 03/07/2000, 05/05/2000, 07/06/2000, 01/04/2001   HPV 9-valent 12/11/2014, 05/23/2015   Hepatitis A, Ped/Adol-2 Dose 10/14/2006, 03/11/2010   Hepatitis B, PED/ADOLESCENT January 06, 2000, 03/07/2000, 10/04/2000   IPV 03/07/2000, 05/05/2000, 01/04/2001, 10/15/2004   Influenza Nasal 03/11/2010, 02/26/2011   Influenza Split 04/17/2004, 03/18/2006   Influenza, Seasonal, Injecte, Preservative Fre 04/29/2014, 05/23/2015   Influenza,Quad,Nasal, Live 03/09/2012, 03/13/2013   Influenza,inj,Quad PF,6+ Mos 06/17/2016, 02/28/2018, 02/17/2021   MMR 01/04/2001, 10/15/2004   Meningococcal B, OMV 10/11/2017, 02/28/2018   Meningococcal Conjugate 02/26/2011, 02/02/2016   PFIZER Comirnaty(Gray Top)Covid-19 Tri-Sucrose Vaccine 02/08/2020, 03/22/2020    Pneumococcal Conjugate-13 03/07/2000, 05/05/2000, 07/06/2000, 02/01/2002   Tdap 12/20/2010, 02/17/2021   Varicella 01/04/2001, 10/14/2006    Health Maintenance  Topic Date Due   COVID-19 Vaccine (3 - 2024-25 season) 09/10/2023 (Originally 02/20/2023)   INFLUENZA VACCINE  09/19/2023 (Originally 01/20/2023)   Cervical Cancer Screening (Pap smear)  10/25/2023 (Originally 12/31/2020)   Hepatitis C Screening  10/25/2023 (Originally 12/31/2017)   HIV Screening  10/25/2023 (Originally 01/01/2015)   DTaP/Tdap/Td (8 - Td or Tdap) 02/18/2031   Pneumococcal Vaccine 10-21 Years old  Completed   HPV VACCINES  Completed    Discussed health  benefits of physical activity, and encouraged her to engage in regular exercise appropriate for her age and condition.  1. Annual physical exam (Primary) Deferring labs today.  Up-to-date on preventative care.  Wellness information provided with AVS.  2. Anxiety Not currently on any medications for anxiety but is very interested in getting started on something.  She would like to hold off on starting medication for now but is requesting GeneSight testing to help guide our medication choices.  Ordering GeneSight testing and once the report comes back, we will plan to discuss.  3. Cervical cancer screening Attempted her first Pap smear today.  Using small speculum, only able to advance a short way into the vaginal canal before she began to experience pain.  Ultimately, she is not sexually active and has never had penetrative intercourse.  Deferring cervical cancer screening at this time.  Advised to return for repeat attempt next year or if she becomes sexually active sooner.  Patient verbalized understanding and is agreeable to the plan.   Return in about 1 year (around 08/24/2024) for annual physical exam or sooner if needed.     Kaylee Butter, NP

## 2023-08-25 NOTE — Patient Instructions (Signed)

## 2023-08-31 ENCOUNTER — Encounter: Payer: Self-pay | Admitting: Medical-Surgical

## 2023-08-31 DIAGNOSIS — L600 Ingrowing nail: Secondary | ICD-10-CM

## 2023-09-08 ENCOUNTER — Ambulatory Visit: Admitting: Podiatry

## 2023-09-15 ENCOUNTER — Ambulatory Visit: Admitting: Podiatry

## 2023-09-15 ENCOUNTER — Encounter: Payer: Self-pay | Admitting: Podiatry

## 2023-09-15 DIAGNOSIS — L600 Ingrowing nail: Secondary | ICD-10-CM | POA: Diagnosis not present

## 2023-09-15 NOTE — Progress Notes (Signed)
  Subjective:  Patient ID: Kaylee Carson, female    DOB: 2000/08/29,   MRN: [id number]  No chief complaint on file.   23 y.o. female presents for concern of right ingrown toenail. Relates she has been dealing with for a long time. She relates currently does not have much pain in the toe but wondering what should be done for the nail.  . Denies any other pedal complaints. Denies n/v/f/c.   Past Medical History:  Diagnosis Date   Anxiety    GERD (gastroesophageal reflux disease)     Objective:  Physical Exam: Vascular: DP/PT pulses 2/4 bilateral. CFT <3 seconds. Normal hair growth on digits. No edema.  Skin. No lacerations or abrasions bilateral feet. Minimal incurvation of lateral borders of great toenails. No ingrowing portions noted. No erythema edema or purulence noted. No tenderness Musculoskeletal: MMT 5/5 bilateral lower extremities in DF, PF, Inversion and Eversion. Deceased ROM in DF of ankle joint.  Neurological: Sensation intact to light touch.   Assessment:   1. Ingrown nail      Plan:  Patient was evaluated and treated and all questions answered. Discussed ingrown toenails etiology and treatment options including procedure for removal vs conservative care.  Discussed soaking and neosporin and proper nail trimming techniques to avoid ingrowns.  Advised if any return of pain to return and would consider ingrown nail procedure.  Return as needed.    Louann Sjogren, DPM

## 2023-09-22 ENCOUNTER — Encounter: Payer: Self-pay | Admitting: Medical-Surgical

## 2023-09-27 ENCOUNTER — Encounter: Payer: Self-pay | Admitting: Medical-Surgical
